# Patient Record
Sex: Female | Born: 1990
Health system: Southern US, Community
[De-identification: ages and names within clinical notes are randomized; demographics above are authoritative.]

## PROBLEM LIST (undated history)

## (undated) ENCOUNTER — Inpatient Hospital Stay (HOSPITAL_COMMUNITY): Payer: BC Managed Care – PPO

## (undated) ENCOUNTER — Inpatient Hospital Stay (HOSPITAL_COMMUNITY): Payer: Self-pay

## (undated) DIAGNOSIS — Z789 Other specified health status: Secondary | ICD-10-CM

## (undated) DIAGNOSIS — O09299 Supervision of pregnancy with other poor reproductive or obstetric history, unspecified trimester: Secondary | ICD-10-CM

## (undated) DIAGNOSIS — Z5189 Encounter for other specified aftercare: Secondary | ICD-10-CM

## (undated) DIAGNOSIS — D509 Iron deficiency anemia, unspecified: Secondary | ICD-10-CM

## (undated) HISTORY — DX: Iron deficiency anemia, unspecified: D50.9

## (undated) HISTORY — PX: ACHILLES TENDON REPAIR: SUR1153

---
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
## 2014-04-19 LAB — OB RESULTS CONSOLE HIV ANTIBODY (ROUTINE TESTING): HIV: NONREACTIVE

## 2014-04-26 ENCOUNTER — Other Ambulatory Visit (HOSPITAL_COMMUNITY)
Admission: RE | Admit: 2014-04-26 | Discharge: 2014-04-26 | Disposition: A | Discharge status: Home / Self Care | Payer: BC Managed Care – PPO | Source: Ambulatory Visit | Attending: Obstetrics and Gynecology | Admitting: Obstetrics and Gynecology

## 2014-04-26 ENCOUNTER — Other Ambulatory Visit: Payer: Self-pay | Admitting: Obstetrics and Gynecology

## 2014-04-26 DIAGNOSIS — Z01419 Encounter for gynecological examination (general) (routine) without abnormal findings: Secondary | ICD-10-CM | POA: Insufficient documentation

## 2014-04-26 DIAGNOSIS — Z113 Encounter for screening for infections with a predominantly sexual mode of transmission: Secondary | ICD-10-CM | POA: Insufficient documentation

## 2014-08-24 LAB — OB RESULTS CONSOLE ANTIBODY SCREEN: ANTIBODY SCREEN: NEGATIVE

## 2014-08-24 LAB — OB RESULTS CONSOLE ABO/RH: RH TYPE: POSITIVE

## 2014-08-24 LAB — OB RESULTS CONSOLE RPR: RPR: NONREACTIVE

## 2014-09-24 ENCOUNTER — Inpatient Hospital Stay (HOSPITAL_COMMUNITY)
Admission: AD | Admit: 2014-09-24 | Discharge: 2014-09-25 | Disposition: A | Discharge status: Home / Self Care | Payer: Medicaid Other | Source: Ambulatory Visit | Attending: Obstetrics and Gynecology | Admitting: Obstetrics and Gynecology

## 2014-09-24 ENCOUNTER — Encounter (HOSPITAL_COMMUNITY): Payer: Self-pay | Admitting: *Deleted

## 2014-09-24 DIAGNOSIS — O4700 False labor before 37 completed weeks of gestation, unspecified trimester: Secondary | ICD-10-CM | POA: Insufficient documentation

## 2014-09-24 DIAGNOSIS — T50905A Adverse effect of unspecified drugs, medicaments and biological substances, initial encounter: Secondary | ICD-10-CM

## 2014-09-24 DIAGNOSIS — T46905A Adverse effect of unspecified agents primarily affecting the cardiovascular system, initial encounter: Secondary | ICD-10-CM | POA: Insufficient documentation

## 2014-09-24 DIAGNOSIS — O4703 False labor before 37 completed weeks of gestation, third trimester: Secondary | ICD-10-CM

## 2014-09-24 HISTORY — DX: Other specified health status: Z78.9

## 2014-09-24 LAB — URINALYSIS, ROUTINE W REFLEX MICROSCOPIC
Bilirubin Urine: NEGATIVE
Glucose, UA: NEGATIVE mg/dL
HGB URINE DIPSTICK: NEGATIVE
Ketones, ur: NEGATIVE mg/dL
LEUKOCYTES UA: NEGATIVE
NITRITE: NEGATIVE
PROTEIN: NEGATIVE mg/dL
SPECIFIC GRAVITY, URINE: 1.005 (ref 1.005–1.030)
UROBILINOGEN UA: 0.2 mg/dL (ref 0.0–1.0)
pH: 7 (ref 5.0–8.0)

## 2014-09-24 LAB — WET PREP, GENITAL
Clue Cells Wet Prep HPF POC: NONE SEEN
Trich, Wet Prep: NONE SEEN
YEAST WET PREP: NONE SEEN

## 2014-09-24 LAB — FETAL FIBRONECTIN: FETAL FIBRONECTIN: NEGATIVE

## 2014-09-24 MED ORDER — NIFEDIPINE 10 MG PO CAPS
10.0000 mg | ORAL_CAPSULE | ORAL | Status: DC | PRN
Start: 2014-09-24 — End: 2014-09-25
  Administered 2014-09-24 (×3): 10 mg via ORAL
  Filled 2014-09-24 (×3): qty 1

## 2014-09-24 MED ORDER — NIFEDIPINE 10 MG PO CAPS: 10.0000 mg | ORAL_CAPSULE | Freq: Four times a day (QID) | ORAL | Status: DC | PRN

## 2014-09-24 MED ORDER — DIPHENHYDRAMINE HCL 25 MG PO CAPS
50.0000 mg | ORAL_CAPSULE | Freq: Once | ORAL | Status: AC
  Administered 2014-09-24: 50 mg via ORAL
  Filled 2014-09-24: qty 2

## 2014-09-24 NOTE — MAU Provider Note (Signed)
Chief Complaint:  Labor Eval   First Provider Initiated Contact with Patient 09/24/14 2125      HPI: Penny Abbott is a 23 y.o. G1P0 at [redacted]w[redacted]d who presents to maternity admissions reporting increasingly frequent, mild contractions since this afternoon. Every 10 minutes at their most frequent. Better with rest and fluids.   Denies fever, chills, vaginal bleeding, vaginal discharge, vaginal odor, leakage of fluid or urinary complaints. Good fetal movement.   Pregnancy Course: Uncomplicated. Her previous issues with preterm labor.  Past Medical History: Past Medical History  Diagnosis Date  . Medical history non-contributory     Past obstetric history: OB History  Gravida Para Term Preterm AB SAB TAB Ectopic Multiple Living  1             # Outcome Date GA Lbr Len/2nd Weight Sex Delivery Anes PTL Lv  1 CUR               Past Surgical History: Past Surgical History  Procedure Laterality Date  . Achilles tendon repair       Family History: No family history on file.  Social History: History  Substance Use Topics  . Smoking status: Never Smoker   . Smokeless tobacco: Not on file  . Alcohol Use: No    Allergies: No Known Allergies  Meds:  Prescriptions prior to admission  Medication Sig Dispense Refill  . Prenatal Vit-Fe Fumarate-FA (PRENATAL MULTIVITAMIN) TABS tablet Take 1 tablet by mouth daily at 12 noon.        ROS: Pertinent findings in history of present illness.  Physical Exam  Blood pressure 111/65, pulse 84, temperature 98.7 F (37.1 C), temperature source Oral, height  (1.676 m), weight 83.008 kg (183 lb), last menstrual period 02/20/2014. GENERAL: Well-developed, well-nourished female in no acute distress.  HEENT: normocephalic HEART: normal rate RESP: normal effort ABDOMEN: Soft, non-tender, gravid appropriate for gestational age EXTREMITIES: Nontender, no edema NEURO: alert and oriented PELVIC EXAM: NEFG, physiologic discharge, no  blood. Dilation: Closed Effacement (%): Thick Cervical Position: Posterior Station: Ballotable Exam by:: [place] CNM  FHT:  Baseline 120's , moderate variability, 15x15 accelerations present, rare, mild variable decelerations Contractions: Frequent uterine irritability initially and then contractions Q3-8 minutes, mild   Labs: Results for orders placed during the hospital encounter of 09/24/14 (from the past 24 hour(s))  URINALYSIS, ROUTINE W REFLEX MICROSCOPIC     Status: None   Collection Time    09/24/14  8:30 PM      Result Value Ref Range   Color, Urine YELLOW  YELLOW   APPearance CLEAR  CLEAR   Specific Gravity, Urine 1.005  1.005 - 1.030   pH 7.0  5.0 - 8.0   Glucose, UA NEGATIVE  NEGATIVE mg/dL   Hgb urine dipstick NEGATIVE  NEGATIVE   Bilirubin Urine NEGATIVE  NEGATIVE   Ketones, ur NEGATIVE  NEGATIVE mg/dL   Protein, ur NEGATIVE  NEGATIVE mg/dL   Urobilinogen, UA 0.2  0.0 - 1.0 mg/dL   Nitrite NEGATIVE  NEGATIVE   Leukocytes, UA NEGATIVE  NEGATIVE  FETAL FIBRONECTIN     Status: None   Collection Time    09/24/14  9:29 PM      Result Value Ref Range   Fetal Fibronectin NEGATIVE  NEGATIVE  WET PREP, GENITAL     Status: Abnormal   Collection Time    09/24/14 10:35 PM      Result Value Ref Range   Yeast Wet Prep HPF POC NONE  SEEN  NONE SEEN   Trich, Wet Prep NONE SEEN  NONE SEEN   Clue Cells Wet Prep HPF POC NONE SEEN  NONE SEEN   WBC, Wet Prep HPF POC FEW (*) NONE SEEN    Imaging:  No results found. MAU Course: Fetal fibronectin, Push fluids.  Dr. Richardson Dopp notified of cervical exam, contractions and negative fetal fibronectin. Procardia and wet mount ordered.  Contractions decreased. Now only UI. Discharge with Procardia prescription per Dr. Richardson Dopp.  2336: After three doses of Procardia pt reported feeling as if she had broken out on the skin on either side of her chin. Denies itching, swelling of lips or tongue, wheezing, difficulty breathing. No rash or  hives visible. Patient in no distress. Dr. Richardson Dopp notified. Give Benadryl 50 mg by mouth x1. Do not send patient home with Procardia prescription. Observe for one hour. No further fetal monitoring. BP 105/57  Pulse 95  Temp(Src) 98.7 F (37.1 C) (Oral)  Resp 16  Ht  (1.676 m)  Wt 83.008 kg (183 lb)  BMI 29.55 kg/m2  SpO2 98%  LMP 02/20/2014  "Breakout" resolved. No further reaction.   Assessment: 1. Preterm contractions, third trimester   2. Idiosyncratic reaction to medication after proper dose, initial encounter    Plan: Discharge home in stable condition per consult with Dr. Richardson Dopp. Labor precautions and fetal kick counts. Medication allergy precautions reviewed.     Follow-up Information   Follow up with Jessee Avers., MD 09/28/2014 As scheduled or as needed if symptoms worsen)    Specialty:  Obstetrics and Gynecology   Contact information:   [address] [place] [address] [phone]       Follow up with [facility] [place] [facility]. (As needed in emergencies)    Contact information:   [address] [id number] [place] [address] [phone]       Medication List         prenatal multivitamin Tabs tablet  Take 1 tablet by mouth daily at 12 noon.        [place], CNM 09/24/2014 11:22 PM

## 2014-09-24 NOTE — MAU Note (Signed)
Pt. States that she has been having irregular contractions for the past 3 days.  Today that have been more regular and her Dr. Catalina Pizza her to come in if she had uc's q 5 minutes for an hour.  Pt. States that her uc's are q 10 minutes while she is laying down, but when she gets up they are more frequent.  Pt. Denies any bleeding or gushes of fluid.

## 2014-09-24 NOTE — Discharge Instructions (Signed)
Preterm Labor Information Preterm labor is when labor starts at less than 37 weeks of pregnancy. The normal length of a pregnancy is 39 to 41 weeks. CAUSES Often, there is no identifiable underlying cause as to why a woman goes into preterm labor. One of the most common known causes of preterm labor is infection. Infections of the uterus, cervix, vagina, amniotic sac, bladder, kidney, or even the lungs (pneumonia) can cause labor to start. Other suspected causes of preterm labor include:   Urogenital infections, such as yeast infections and bacterial vaginosis.   Uterine abnormalities (uterine shape, uterine septum, fibroids, or bleeding from the placenta).   A cervix that has been operated on (it may fail to stay closed).   Malformations in the fetus.   Multiple gestations (twins, triplets, and so on).   Breakage of the amniotic sac.  RISK FACTORS  Having a previous history of preterm labor.   Having premature rupture of membranes (PROM).   Having a placenta that covers the opening of the cervix (placenta previa).   Having a placenta that separates from the uterus (placental abruption).   Having a cervix that is too weak to hold the fetus in the uterus (incompetent cervix).   Having too much fluid in the amniotic sac (polyhydramnios).   Taking illegal drugs or smoking while pregnant.   Not gaining enough weight while pregnant.   Being younger than 61 and older than 24 years old.   Having a low socioeconomic status.   Being African American. SYMPTOMS Signs and symptoms of preterm labor include:   Menstrual-like cramps, abdominal pain, or back pain.  Uterine contractions that are regular, as frequent as six in an hour, regardless of their intensity (may be mild or painful).  Contractions that start on the top of the uterus and spread down to the lower abdomen and back.   A sense of increased pelvic pressure.   A watery or bloody mucus discharge that  comes from the vagina.  TREATMENT Depending on the length of the pregnancy and other circumstances, your health care provider may suggest bed rest. If necessary, there are medicines that can be given to stop contractions and to mature the fetal lungs. If labor happens before 34 weeks of pregnancy, a prolonged hospital stay may be recommended. Treatment depends on the condition of both you and the fetus.  WHAT SHOULD YOU DO IF YOU THINK YOU ARE IN PRETERM LABOR? Call your health care provider right away. You will need to go to the hospital to get checked immediately. HOW CAN YOU PREVENT PRETERM LABOR IN FUTURE PREGNANCIES? You should:   Stop smoking if you smoke.  Maintain healthy weight gain and avoid chemicals and drugs that are not necessary.  Be watchful for any type of infection.  Inform your health care provider if you have a known history of preterm labor. Document Released: 03/08/2004 Document Revised: 08/19/2013 Document Reviewed: 01/19/2013 [facility] Patient Information 2015 [place], [place]. This information is not intended to replace advice given to you by your health care provider. Make sure you discuss any questions you have with your health care provider.  Braxton Hicks Contractions Contractions of the uterus can occur throughout pregnancy. Contractions are not always a sign that you are in labor.  WHAT ARE BRAXTON HICKS CONTRACTIONS?  Contractions that occur before labor are called Braxton Hicks contractions, or false labor. Toward the end of pregnancy (32-34 weeks), these contractions can develop more often and may become more forceful. This is not true  not true labor because these contractions do not result in opening (dilatation) and thinning of the cervix. They are sometimes difficult to tell apart from true labor because these contractions can be forceful and people have different pain tolerances. You should not feel embarrassed if you go to the hospital with false labor.  Sometimes, the only way to tell if you are in true labor is for your health care provider to look for changes in the cervix. °If there are no prenatal problems or other health problems associated with the pregnancy, it is completely safe to be sent home with false labor and await the onset of true labor. °HOW CAN YOU TELL THE DIFFERENCE BETWEEN TRUE AND FALSE LABOR? °False Labor °· The contractions of false labor are usually shorter and not as hard as those of true labor.   °· The contractions are usually irregular.   °· The contractions are often felt in the front of the lower abdomen and in the groin.   °· The contractions may go away when you walk around or change positions while lying down.   °· The contractions get weaker and are shorter lasting as time goes on.   °· The contractions do not usually become progressively stronger, regular, and closer together as with true labor.   °True Labor °· Contractions in true labor last 30-70 seconds, become very regular, usually become more intense, and increase in frequency.   °· The contractions do not go away with walking.   °· The discomfort is usually felt in the top of the uterus and spreads to the lower abdomen and low back.   °· True labor can be determined by your health care provider with an exam. This will show that the cervix is dilating and getting thinner.   °WHAT TO REMEMBER °· Keep up with your usual exercises and follow other instructions given by your health care provider.   °· Take medicines as directed by your health care provider.   °· Keep your regular prenatal appointments.   °· Eat and drink lightly if you think you are going into labor.   °· If Braxton Hicks contractions are making you uncomfortable:   °¨ Change your position from lying down or resting to walking, or from walking to resting.   °¨ Sit and rest in a tub of warm water.   °¨ Drink 2-3 glasses of water. Dehydration may cause these contractions.   °¨ Do slow and deep breathing several  times an hour.   °WHEN SHOULD I SEEK IMMEDIATE MEDICAL CARE? °Seek immediate medical care if: °· Your contractions become stronger, more regular, and closer together.   °· You have fluid leaking or gushing from your vagina.   °· You have a fever.   °· You pass blood-tinged mucus.   °· You have vaginal bleeding.   °· You have continuous abdominal pain.   °· You have low back pain that you never had before.   °· You feel your baby's head pushing down and causing pelvic pressure.   °· Your baby is not moving as much as it used to.   °Document Released: 12/17/2005 Document Revised: 12/22/2013 Document Reviewed: 09/28/2013 °[employer]® Patient Information ©2015 [employer]. This information is not intended to replace advice given to you by your health care provider. Make sure you discuss any questions you have with your health care provider. ° °

## 2014-10-26 LAB — OB RESULTS CONSOLE GC/CHLAMYDIA
Chlamydia: NEGATIVE
Gonorrhea: NEGATIVE

## 2014-10-26 LAB — OB RESULTS CONSOLE GBS: GBS: NEGATIVE

## 2014-11-01 ENCOUNTER — Encounter (HOSPITAL_COMMUNITY): Payer: Self-pay | Admitting: *Deleted

## 2014-11-22 ENCOUNTER — Inpatient Hospital Stay (HOSPITAL_COMMUNITY)
Admission: AD | Admit: 2014-11-22 | Discharge: 2014-11-26 | DRG: 765 | Disposition: A | Discharge status: Home / Self Care | Payer: Medicaid Other | Source: Ambulatory Visit | Attending: Obstetrics and Gynecology | Admitting: Obstetrics and Gynecology

## 2014-11-22 ENCOUNTER — Encounter (HOSPITAL_COMMUNITY): Payer: Self-pay | Admitting: *Deleted

## 2014-11-22 DIAGNOSIS — O8612 Endometritis following delivery: Secondary | ICD-10-CM | POA: Diagnosis present

## 2014-11-22 DIAGNOSIS — D649 Anemia, unspecified: Secondary | ICD-10-CM | POA: Diagnosis not present

## 2014-11-22 DIAGNOSIS — O324XX0 Maternal care for high head at term, not applicable or unspecified: Secondary | ICD-10-CM | POA: Diagnosis present

## 2014-11-22 DIAGNOSIS — Z3490 Encounter for supervision of normal pregnancy, unspecified, unspecified trimester: Secondary | ICD-10-CM

## 2014-11-22 DIAGNOSIS — Z3A39 39 weeks gestation of pregnancy: Secondary | ICD-10-CM | POA: Diagnosis present

## 2014-11-22 DIAGNOSIS — Z3403 Encounter for supervision of normal first pregnancy, third trimester: Secondary | ICD-10-CM | POA: Diagnosis present

## 2014-11-22 DIAGNOSIS — Z98891 History of uterine scar from previous surgery: Secondary | ICD-10-CM

## 2014-11-22 LAB — CBC
HCT: 39.7 % (ref 36.0–46.0)
Hemoglobin: 13.8 g/dL (ref 12.0–15.0)
MCH: 32.5 pg (ref 26.0–34.0)
MCHC: 34.8 g/dL (ref 30.0–36.0)
MCV: 93.6 fL (ref 78.0–100.0)
PLATELETS: 136 10*3/uL — AB (ref 150–400)
RBC: 4.24 MIL/uL (ref 3.87–5.11)
RDW: 14.4 % (ref 11.5–15.5)
WBC: 13.7 10*3/uL — AB (ref 4.0–10.5)

## 2014-11-22 MED ORDER — LACTATED RINGERS IV SOLN
INTRAVENOUS | Status: DC
  Administered 2014-11-22 – 2014-11-23 (×5): via INTRAVENOUS

## 2014-11-22 MED ORDER — OXYTOCIN BOLUS FROM INFUSION: 500.0000 mL | INTRAVENOUS | Status: DC

## 2014-11-22 MED ORDER — OXYCODONE-ACETAMINOPHEN 5-325 MG PO TABS: 2.0000 | ORAL_TABLET | ORAL | Status: DC | PRN

## 2014-11-22 MED ORDER — CITRIC ACID-SODIUM CITRATE 334-500 MG/5ML PO SOLN
30.0000 mL | ORAL | Status: DC | PRN
  Administered 2014-11-23: 30 mL via ORAL
  Filled 2014-11-22: qty 15

## 2014-11-22 MED ORDER — ACETAMINOPHEN 325 MG PO TABS: 650.0000 mg | ORAL_TABLET | ORAL | Status: DC | PRN

## 2014-11-22 MED ORDER — LIDOCAINE HCL (PF) 1 % IJ SOLN
30.0000 mL | INTRAMUSCULAR | Status: DC | PRN
  Filled 2014-11-22: qty 30

## 2014-11-22 MED ORDER — OXYCODONE-ACETAMINOPHEN 5-325 MG PO TABS: 1.0000 | ORAL_TABLET | ORAL | Status: DC | PRN

## 2014-11-22 MED ORDER — BUTORPHANOL TARTRATE 1 MG/ML IJ SOLN
1.0000 mg | INTRAMUSCULAR | Status: DC | PRN
  Administered 2014-11-22 – 2014-11-23 (×2): 1 mg via INTRAVENOUS
  Filled 2014-11-22 (×2): qty 1

## 2014-11-22 MED ORDER — OXYTOCIN 40 UNITS IN LACTATED RINGERS INFUSION - SIMPLE MED
62.5000 mL/h | INTRAVENOUS | Status: DC
  Filled 2014-11-22: qty 1000

## 2014-11-22 MED ORDER — ONDANSETRON HCL 4 MG/2ML IJ SOLN: 4.0000 mg | Freq: Four times a day (QID) | INTRAMUSCULAR | Status: DC | PRN

## 2014-11-22 MED ORDER — LACTATED RINGERS IV SOLN: 500.0000 mL | INTRAVENOUS | Status: DC | PRN

## 2014-11-22 NOTE — MAU Note (Signed)
Pt reports uc's q 2-5 minutes since 6pm,

## 2014-11-23 ENCOUNTER — Inpatient Hospital Stay (HOSPITAL_COMMUNITY): Payer: Medicaid Other | Admitting: Certified Registered"

## 2014-11-23 ENCOUNTER — Encounter (HOSPITAL_COMMUNITY)
Admission: AD | Disposition: A | Discharge status: Home / Self Care | Payer: Self-pay | Source: Ambulatory Visit | Attending: Obstetrics and Gynecology

## 2014-11-23 ENCOUNTER — Inpatient Hospital Stay (HOSPITAL_COMMUNITY): Payer: Medicaid Other

## 2014-11-23 ENCOUNTER — Encounter (HOSPITAL_COMMUNITY): Payer: Self-pay | Admitting: Registered Nurse

## 2014-11-23 ENCOUNTER — Inpatient Hospital Stay (HOSPITAL_COMMUNITY): Payer: Medicaid Other | Admitting: Anesthesiology

## 2014-11-23 ENCOUNTER — Encounter (HOSPITAL_COMMUNITY): Payer: Self-pay | Admitting: Anesthesiology

## 2014-11-23 HISTORY — PX: DILATION AND EVACUATION: SHX1459

## 2014-11-23 LAB — CBC
HCT: 38 % (ref 36.0–46.0)
HCT: 35.8 % — ABNORMAL LOW (ref 36.0–46.0)
HCT: 27.7 % — ABNORMAL LOW (ref 36.0–46.0)
HEMOGLOBIN: 12.9 g/dL (ref 12.0–15.0)
Hemoglobin: 12.1 g/dL (ref 12.0–15.0)
Hemoglobin: 9.4 g/dL — ABNORMAL LOW (ref 12.0–15.0)
MCH: 32.1 pg (ref 26.0–34.0)
MCH: 32.1 pg (ref 26.0–34.0)
MCH: 32.3 pg (ref 26.0–34.0)
MCHC: 33.9 g/dL (ref 30.0–36.0)
MCHC: 33.8 g/dL (ref 30.0–36.0)
MCHC: 33.9 g/dL (ref 30.0–36.0)
MCV: 94.5 fL (ref 78.0–100.0)
MCV: 95 fL (ref 78.0–100.0)
MCV: 95.2 fL (ref 78.0–100.0)
PLATELETS: 124 10*3/uL — AB (ref 150–400)
Platelets: 134 10*3/uL — ABNORMAL LOW (ref 150–400)
Platelets: 126 10*3/uL — ABNORMAL LOW (ref 150–400)
RBC: 4.02 MIL/uL (ref 3.87–5.11)
RBC: 3.77 MIL/uL — AB (ref 3.87–5.11)
RBC: 2.91 MIL/uL — AB (ref 3.87–5.11)
RDW: 14.4 % (ref 11.5–15.5)
RDW: 14.4 % (ref 11.5–15.5)
RDW: 14.4 % (ref 11.5–15.5)
WBC: 14.8 10*3/uL — ABNORMAL HIGH (ref 4.0–10.5)
WBC: 19.4 10*3/uL — AB (ref 4.0–10.5)
WBC: 21.3 10*3/uL — ABNORMAL HIGH (ref 4.0–10.5)

## 2014-11-23 LAB — COMPREHENSIVE METABOLIC PANEL
ALBUMIN: 3 g/dL — AB (ref 3.5–5.2)
ALK PHOS: 130 U/L — AB (ref 39–117)
ALT: 13 U/L (ref 0–35)
ANION GAP: 13 (ref 5–15)
AST: 17 U/L (ref 0–37)
BUN: 8 mg/dL (ref 6–23)
CO2: 22 mEq/L (ref 19–32)
Calcium: 9.2 mg/dL (ref 8.4–10.5)
Chloride: 101 mEq/L (ref 96–112)
Creatinine, Ser: 0.53 mg/dL (ref 0.50–1.10)
GFR calc Af Amer: >90 mL/min (ref >90)
GFR calc non Af Amer: >90 mL/min (ref >90)
Glucose, Bld: 91 mg/dL (ref 70–99)
Potassium: 4 mEq/L (ref 3.7–5.3)
Sodium: 136 mEq/L — ABNORMAL LOW (ref 137–147)
TOTAL PROTEIN: 6.3 g/dL (ref 6.0–8.3)
Total Bilirubin: 0.2 mg/dL — ABNORMAL LOW (ref 0.3–1.2)

## 2014-11-23 LAB — ABO/RH: ABO/RH(D): A POS

## 2014-11-23 LAB — URIC ACID: Uric Acid, Serum: 3.1 mg/dL (ref 2.4–7.0)

## 2014-11-23 LAB — HEPATITIS B SURFACE ANTIGEN: Hepatitis B Surface Ag: NEGATIVE

## 2014-11-23 LAB — FIBRINOGEN: FIBRINOGEN: 250 mg/dL (ref 204–475)

## 2014-11-23 LAB — LACTATE DEHYDROGENASE: LDH: 249 U/L (ref 94–250)

## 2014-11-23 LAB — HIV ANTIBODY (ROUTINE TESTING W REFLEX): HIV: NONREACTIVE

## 2014-11-23 LAB — PROTIME-INR
INR: 1.24 (ref 0.00–1.49)
PROTHROMBIN TIME: 15.7 s — AB (ref 11.6–15.2)

## 2014-11-23 LAB — APTT: aPTT: 31 seconds (ref 24–37)

## 2014-11-23 LAB — RUBELLA SCREEN: Rubella: 2.59 Index — ABNORMAL HIGH (ref <0.90)

## 2014-11-23 LAB — RPR

## 2014-11-23 SURGERY — Surgical Case
Anesthesia: Epidural

## 2014-11-23 SURGERY — DILATION AND EVACUATION, UTERUS
Anesthesia: Spinal | Site: Vagina

## 2014-11-23 MED ORDER — METOCLOPRAMIDE HCL 5 MG/ML IJ SOLN: 10.0000 mg | Freq: Once | INTRAMUSCULAR | Status: AC | PRN

## 2014-11-23 MED ORDER — METHYLERGONOVINE MALEATE 0.2 MG PO TABS
0.2000 mg | ORAL_TABLET | Freq: Four times a day (QID) | ORAL | Status: DC
  Administered 2014-11-23: 0.2 mg via ORAL
  Filled 2014-11-23: qty 1

## 2014-11-23 MED ORDER — MISOPROSTOL 200 MCG PO TABS
ORAL_TABLET | ORAL | Status: AC
  Administered 2014-11-23: 1000 ug via RECTAL
  Filled 2014-11-23: qty 5

## 2014-11-23 MED ORDER — FENTANYL 2.5 MCG/ML BUPIVACAINE 1/10 % EPIDURAL INFUSION (WH - ANES)
14.0000 mL/h | INTRAMUSCULAR | Status: DC | PRN
  Administered 2014-11-23 (×2): 14 mL/h via EPIDURAL
  Filled 2014-11-23 (×2): qty 125

## 2014-11-23 MED ORDER — NALOXONE HCL 1 MG/ML IJ SOLN: 1.0000 ug/kg/h | INTRAVENOUS | Status: DC | PRN

## 2014-11-23 MED ORDER — FENTANYL CITRATE 0.05 MG/ML IJ SOLN
INTRAMUSCULAR | Status: AC
  Administered 2014-11-23: 50 ug via INTRAVENOUS
  Filled 2014-11-23: qty 2

## 2014-11-23 MED ORDER — SODIUM BICARBONATE 8.4 % IV SOLN
INTRAVENOUS | Status: AC
  Filled 2014-11-23: qty 50

## 2014-11-23 MED ORDER — OXYCODONE-ACETAMINOPHEN 5-325 MG PO TABS: 2.0000 | ORAL_TABLET | ORAL | Status: DC | PRN

## 2014-11-23 MED ORDER — BUPIVACAINE HCL (PF) 0.25 % IJ SOLN
INTRAMUSCULAR | Status: AC
  Filled 2014-11-23: qty 30

## 2014-11-23 MED ORDER — FENTANYL CITRATE 0.05 MG/ML IJ SOLN
INTRAMUSCULAR | Status: AC
  Filled 2014-11-23: qty 2

## 2014-11-23 MED ORDER — PHENYLEPHRINE HCL 10 MG/ML IJ SOLN
INTRAMUSCULAR | Status: DC | PRN
  Administered 2014-11-23: 40 ug via INTRAVENOUS
  Administered 2014-11-23: 80 ug via INTRAVENOUS

## 2014-11-23 MED ORDER — OXYTOCIN 40 UNITS IN LACTATED RINGERS INFUSION - SIMPLE MED: 62.5000 mL/h | INTRAVENOUS | Status: AC

## 2014-11-23 MED ORDER — FENTANYL CITRATE 0.05 MG/ML IJ SOLN
INTRAMUSCULAR | Status: AC
Start: 2014-11-23 — End: 2014-11-23
  Filled 2014-11-23: qty 2

## 2014-11-23 MED ORDER — LANOLIN HYDROUS EX OINT: 1.0000 "application " | TOPICAL_OINTMENT | CUTANEOUS | Status: DC | PRN

## 2014-11-23 MED ORDER — CLINDAMYCIN PHOSPHATE 900 MG/50ML IV SOLN
900.0000 mg | Freq: Three times a day (TID) | INTRAVENOUS | Status: DC
  Administered 2014-11-24 – 2014-11-26 (×8): 900 mg via INTRAVENOUS
  Filled 2014-11-23 (×10): qty 50

## 2014-11-23 MED ORDER — WITCH HAZEL-GLYCERIN EX PADS: 1.0000 "application " | MEDICATED_PAD | CUTANEOUS | Status: DC | PRN

## 2014-11-23 MED ORDER — OXYCODONE-ACETAMINOPHEN 5-325 MG PO TABS
1.0000 | ORAL_TABLET | ORAL | Status: DC | PRN
  Administered 2014-11-24: 1 via ORAL
  Filled 2014-11-23: qty 1

## 2014-11-23 MED ORDER — SODIUM CHLORIDE 0.9 % IJ SOLN: 3.0000 mL | INTRAMUSCULAR | Status: DC | PRN

## 2014-11-23 MED ORDER — LIDOCAINE HCL (PF) 1 % IJ SOLN
INTRAMUSCULAR | Status: DC | PRN
  Administered 2014-11-23 (×4): 4 mL

## 2014-11-23 MED ORDER — NALBUPHINE HCL 10 MG/ML IJ SOLN
5.0000 mg | INTRAMUSCULAR | Status: DC | PRN
  Administered 2014-11-24: 5 mg via INTRAVENOUS
  Filled 2014-11-23: qty 1

## 2014-11-23 MED ORDER — FENTANYL CITRATE 0.05 MG/ML IJ SOLN: 25.0000 ug | INTRAMUSCULAR | Status: DC | PRN

## 2014-11-23 MED ORDER — FENTANYL CITRATE 0.05 MG/ML IJ SOLN
25.0000 ug | INTRAMUSCULAR | Status: DC | PRN
  Administered 2014-11-23 (×2): 50 ug via INTRAVENOUS

## 2014-11-23 MED ORDER — METHYLERGONOVINE MALEATE 0.2 MG PO TABS: 0.2000 mg | ORAL_TABLET | ORAL | Status: DC | PRN

## 2014-11-23 MED ORDER — SIMETHICONE 80 MG PO CHEW
80.0000 mg | CHEWABLE_TABLET | Freq: Three times a day (TID) | ORAL | Status: DC
  Administered 2014-11-24 – 2014-11-26 (×8): 80 mg via ORAL
  Filled 2014-11-23 (×8): qty 1

## 2014-11-23 MED ORDER — IBUPROFEN 600 MG PO TABS
600.0000 mg | ORAL_TABLET | Freq: Four times a day (QID) | ORAL | Status: DC
  Administered 2014-11-23 – 2014-11-26 (×12): 600 mg via ORAL
  Filled 2014-11-23 (×12): qty 1

## 2014-11-23 MED ORDER — MIDAZOLAM HCL 2 MG/2ML IJ SOLN
INTRAMUSCULAR | Status: AC
  Filled 2014-11-23: qty 2

## 2014-11-23 MED ORDER — DEXAMETHASONE SODIUM PHOSPHATE 10 MG/ML IJ SOLN
INTRAMUSCULAR | Status: AC
  Filled 2014-11-23: qty 1

## 2014-11-23 MED ORDER — MEPERIDINE HCL 25 MG/ML IJ SOLN: 6.2500 mg | INTRAMUSCULAR | Status: DC | PRN

## 2014-11-23 MED ORDER — EPHEDRINE 5 MG/ML INJ: 10.0000 mg | INTRAVENOUS | Status: DC | PRN

## 2014-11-23 MED ORDER — METHYLERGONOVINE MALEATE 0.2 MG/ML IJ SOLN
INTRAMUSCULAR | Status: AC
  Filled 2014-11-23: qty 1

## 2014-11-23 MED ORDER — SENNOSIDES-DOCUSATE SODIUM 8.6-50 MG PO TABS
2.0000 | ORAL_TABLET | ORAL | Status: DC
  Administered 2014-11-23 – 2014-11-25 (×3): 2 via ORAL
  Filled 2014-11-23 (×3): qty 2

## 2014-11-23 MED ORDER — NALBUPHINE HCL 10 MG/ML IJ SOLN: 5.0000 mg | Freq: Once | INTRAMUSCULAR | Status: AC | PRN

## 2014-11-23 MED ORDER — ZOLPIDEM TARTRATE 5 MG PO TABS: 5.0000 mg | ORAL_TABLET | Freq: Every evening | ORAL | Status: DC | PRN

## 2014-11-23 MED ORDER — SCOPOLAMINE 1 MG/3DAYS TD PT72
MEDICATED_PATCH | TRANSDERMAL | Status: DC | PRN
  Administered 2014-11-23: 1 via TRANSDERMAL

## 2014-11-23 MED ORDER — SODIUM CHLORIDE 0.9 % IV SOLN
3.0000 g | Freq: Four times a day (QID) | INTRAVENOUS | Status: DC
  Administered 2014-11-23 – 2014-11-26 (×11): 3 g via INTRAVENOUS
  Filled 2014-11-23 (×14): qty 3

## 2014-11-23 MED ORDER — METHYLERGONOVINE MALEATE 0.2 MG PO TABS
0.2000 mg | ORAL_TABLET | ORAL | Status: AC
  Administered 2014-11-24 (×6): 0.2 mg via ORAL
  Filled 2014-11-23 (×6): qty 1

## 2014-11-23 MED ORDER — METHYLERGONOVINE MALEATE 0.2 MG/ML IJ SOLN
INTRAMUSCULAR | Status: DC | PRN
  Administered 2014-11-23: 0.2 mg via INTRAMUSCULAR

## 2014-11-23 MED ORDER — BUPIVACAINE IN DEXTROSE 0.75-8.25 % IT SOLN
INTRATHECAL | Status: DC | PRN
  Administered 2014-11-23: 1.3 mL via INTRATHECAL

## 2014-11-23 MED ORDER — LIDOCAINE HCL (CARDIAC) 20 MG/ML IV SOLN
INTRAVENOUS | Status: AC
  Filled 2014-11-23: qty 5

## 2014-11-23 MED ORDER — MORPHINE SULFATE (PF) 0.5 MG/ML IJ SOLN
INTRAMUSCULAR | Status: DC | PRN
  Administered 2014-11-23: 3 mg via EPIDURAL

## 2014-11-23 MED ORDER — ACETAMINOPHEN 500 MG PO TABS
ORAL_TABLET | ORAL | Status: AC
  Filled 2014-11-23: qty 1

## 2014-11-23 MED ORDER — OXYTOCIN 10 UNIT/ML IJ SOLN
40.0000 [IU] | INTRAMUSCULAR | Status: DC | PRN
  Administered 2014-11-23: 40 [IU] via INTRAVENOUS

## 2014-11-23 MED ORDER — MISOPROSTOL 200 MCG PO TABS
1000.0000 ug | ORAL_TABLET | Freq: Once | ORAL | Status: AC
  Administered 2014-11-23: 1000 ug via RECTAL

## 2014-11-23 MED ORDER — LACTATED RINGERS IV SOLN
INTRAVENOUS | Status: DC
  Administered 2014-11-23 – 2014-11-24 (×5): via INTRAVENOUS
  Administered 2014-11-24: 125 mL/h via INTRAVENOUS

## 2014-11-23 MED ORDER — FERROUS SULFATE 325 (65 FE) MG PO TABS
325.0000 mg | ORAL_TABLET | Freq: Two times a day (BID) | ORAL | Status: DC
  Administered 2014-11-24 – 2014-11-26 (×5): 325 mg via ORAL
  Filled 2014-11-23 (×5): qty 1

## 2014-11-23 MED ORDER — SCOPOLAMINE 1 MG/3DAYS TD PT72
MEDICATED_PATCH | TRANSDERMAL | Status: AC
  Filled 2014-11-23: qty 1

## 2014-11-23 MED ORDER — DIPHENOXYLATE-ATROPINE 2.5-0.025 MG PO TABS
2.0000 | ORAL_TABLET | Freq: Once | ORAL | Status: AC
  Administered 2014-11-23: 2 via ORAL

## 2014-11-23 MED ORDER — NALBUPHINE HCL 10 MG/ML IJ SOLN: 5.0000 mg | INTRAMUSCULAR | Status: DC | PRN

## 2014-11-23 MED ORDER — CEFAZOLIN SODIUM-DEXTROSE 2-3 GM-% IV SOLR
2.0000 g | Freq: Once | INTRAVENOUS | Status: AC
  Administered 2014-11-23: 2 g via INTRAVENOUS
  Filled 2014-11-23: qty 50

## 2014-11-23 MED ORDER — DIPHENHYDRAMINE HCL 50 MG/ML IJ SOLN
12.5000 mg | INTRAMUSCULAR | Status: DC | PRN
  Administered 2014-11-23: 12.5 mg via INTRAVENOUS

## 2014-11-23 MED ORDER — MENTHOL 3 MG MT LOZG: 1.0000 | LOZENGE | OROMUCOSAL | Status: DC | PRN

## 2014-11-23 MED ORDER — ONDANSETRON HCL 4 MG/2ML IJ SOLN: 4.0000 mg | Freq: Three times a day (TID) | INTRAMUSCULAR | Status: DC | PRN

## 2014-11-23 MED ORDER — PROPOFOL 10 MG/ML IV BOLUS
INTRAVENOUS | Status: DC | PRN
  Administered 2014-11-23: 30 mg via INTRAVENOUS
  Administered 2014-11-23: 20 mg via INTRAVENOUS

## 2014-11-23 MED ORDER — CARBOPROST TROMETHAMINE 250 MCG/ML IM SOLN
INTRAMUSCULAR | Status: AC
  Administered 2014-11-23: 250 ug via INTRAMUSCULAR
  Filled 2014-11-23: qty 1

## 2014-11-23 MED ORDER — ONDANSETRON HCL 4 MG PO TABS: 4.0000 mg | ORAL_TABLET | ORAL | Status: DC | PRN

## 2014-11-23 MED ORDER — DIPHENHYDRAMINE HCL 25 MG PO CAPS: 25.0000 mg | ORAL_CAPSULE | Freq: Four times a day (QID) | ORAL | Status: DC | PRN

## 2014-11-23 MED ORDER — PRENATAL MULTIVITAMIN CH
1.0000 | ORAL_TABLET | Freq: Every day | ORAL | Status: DC
  Administered 2014-11-25 – 2014-11-26 (×2): 1 via ORAL
  Filled 2014-11-23 (×3): qty 1

## 2014-11-23 MED ORDER — ONDANSETRON HCL 4 MG/2ML IJ SOLN: 4.0000 mg | INTRAMUSCULAR | Status: DC | PRN

## 2014-11-23 MED ORDER — ONDANSETRON HCL 4 MG/2ML IJ SOLN
INTRAMUSCULAR | Status: AC
  Filled 2014-11-23: qty 2

## 2014-11-23 MED ORDER — DIPHENHYDRAMINE HCL 50 MG/ML IJ SOLN
INTRAMUSCULAR | Status: AC
  Filled 2014-11-23: qty 1

## 2014-11-23 MED ORDER — MORPHINE SULFATE 0.5 MG/ML IJ SOLN
INTRAMUSCULAR | Status: AC
  Filled 2014-11-23: qty 10

## 2014-11-23 MED ORDER — SCOPOLAMINE 1 MG/3DAYS TD PT72: 1.0000 | MEDICATED_PATCH | Freq: Once | TRANSDERMAL | Status: DC

## 2014-11-23 MED ORDER — METHYLERGONOVINE MALEATE 0.2 MG/ML IJ SOLN
0.2000 mg | INTRAMUSCULAR | Status: DC | PRN
  Administered 2014-11-23: 0.2 mg via INTRAMUSCULAR

## 2014-11-23 MED ORDER — SIMETHICONE 80 MG PO CHEW
80.0000 mg | CHEWABLE_TABLET | ORAL | Status: DC
  Administered 2014-11-23 – 2014-11-25 (×3): 80 mg via ORAL
  Filled 2014-11-23 (×3): qty 1

## 2014-11-23 MED ORDER — SIMETHICONE 80 MG PO CHEW: 80.0000 mg | CHEWABLE_TABLET | ORAL | Status: DC | PRN

## 2014-11-23 MED ORDER — MIDAZOLAM HCL 2 MG/2ML IJ SOLN
INTRAMUSCULAR | Status: DC | PRN
  Administered 2014-11-23: 2 mg via INTRAVENOUS

## 2014-11-23 MED ORDER — ACETAMINOPHEN 500 MG PO TABS
1000.0000 mg | ORAL_TABLET | Freq: Once | ORAL | Status: AC
  Administered 2014-11-23: 1000 mg via ORAL

## 2014-11-23 MED ORDER — PHENYLEPHRINE 40 MCG/ML (10ML) SYRINGE FOR IV PUSH (FOR BLOOD PRESSURE SUPPORT): 80.0000 ug | PREFILLED_SYRINGE | INTRAVENOUS | Status: DC | PRN

## 2014-11-23 MED ORDER — OXYTOCIN 10 UNIT/ML IJ SOLN
INTRAMUSCULAR | Status: AC
  Filled 2014-11-23: qty 4

## 2014-11-23 MED ORDER — PHENYLEPHRINE 40 MCG/ML (10ML) SYRINGE FOR IV PUSH (FOR BLOOD PRESSURE SUPPORT)
80.0000 ug | PREFILLED_SYRINGE | INTRAVENOUS | Status: DC | PRN
  Filled 2014-11-23: qty 10

## 2014-11-23 MED ORDER — FENTANYL CITRATE 0.05 MG/ML IJ SOLN
INTRAMUSCULAR | Status: DC | PRN
  Administered 2014-11-23 (×2): 25 ug via INTRAVENOUS
  Administered 2014-11-23: 50 ug via INTRAVENOUS
  Administered 2014-11-23 (×3): 25 ug via INTRAVENOUS
  Administered 2014-11-23 (×2): 50 ug via INTRAVENOUS
  Administered 2014-11-23: 25 ug via INTRAVENOUS

## 2014-11-23 MED ORDER — DIPHENHYDRAMINE HCL 50 MG/ML IJ SOLN: 12.5000 mg | INTRAMUSCULAR | Status: DC | PRN

## 2014-11-23 MED ORDER — CARBOPROST TROMETHAMINE 250 MCG/ML IM SOLN
250.0000 ug | Freq: Once | INTRAMUSCULAR | Status: AC
  Administered 2014-11-23: 250 ug via INTRAMUSCULAR

## 2014-11-23 MED ORDER — KETOROLAC TROMETHAMINE 30 MG/ML IJ SOLN
INTRAMUSCULAR | Status: AC
  Filled 2014-11-23: qty 1

## 2014-11-23 MED ORDER — PHENYLEPHRINE 40 MCG/ML (10ML) SYRINGE FOR IV PUSH (FOR BLOOD PRESSURE SUPPORT)
PREFILLED_SYRINGE | INTRAVENOUS | Status: AC
Start: 2014-11-23 — End: 2014-11-23
  Filled 2014-11-23: qty 10

## 2014-11-23 MED ORDER — DIBUCAINE 1 % RE OINT: 1.0000 "application " | TOPICAL_OINTMENT | RECTAL | Status: DC | PRN

## 2014-11-23 MED ORDER — DIPHENHYDRAMINE HCL 25 MG PO CAPS
25.0000 mg | ORAL_CAPSULE | ORAL | Status: DC | PRN
  Administered 2014-11-24: 25 mg via ORAL
  Filled 2014-11-23: qty 1

## 2014-11-23 MED ORDER — PHENYLEPHRINE 8 MG IN D5W 100 ML (0.08MG/ML) PREMIX OPTIME
INJECTION | INTRAVENOUS | Status: DC | PRN
  Administered 2014-11-23: 60 ug/min via INTRAVENOUS

## 2014-11-23 MED ORDER — NALOXONE HCL 0.4 MG/ML IJ SOLN: 0.4000 mg | INTRAMUSCULAR | Status: DC | PRN

## 2014-11-23 MED ORDER — LACTATED RINGERS IV SOLN
500.0000 mL | Freq: Once | INTRAVENOUS | Status: AC
  Administered 2014-11-23: 500 mL via INTRAVENOUS

## 2014-11-23 MED ORDER — ONDANSETRON HCL 4 MG/2ML IJ SOLN
INTRAMUSCULAR | Status: DC | PRN
  Administered 2014-11-23: 4 mg via INTRAVENOUS

## 2014-11-23 MED ORDER — LIDOCAINE-EPINEPHRINE (PF) 2 %-1:200000 IJ SOLN
INTRAMUSCULAR | Status: AC
  Filled 2014-11-23: qty 20

## 2014-11-23 MED ORDER — PROPOFOL 10 MG/ML IV EMUL
INTRAVENOUS | Status: AC
  Filled 2014-11-23: qty 20

## 2014-11-23 MED ORDER — SODIUM BICARBONATE 8.4 % IV SOLN
INTRAVENOUS | Status: DC | PRN
  Administered 2014-11-23 (×4): 5 mL via EPIDURAL

## 2014-11-23 SURGICAL SUPPLY — 41 items
APL SKNCLS STERI-STRIP NONHPOA (GAUZE/BANDAGES/DRESSINGS) ×1
BARRIER ADHS 3X4 INTERCEED (GAUZE/BANDAGES/DRESSINGS) ×2 IMPLANT
BENZOIN TINCTURE PRP APPL 2/3 (GAUZE/BANDAGES/DRESSINGS) ×3 IMPLANT
BRR ADH 4X3 ABS CNTRL BYND (GAUZE/BANDAGES/DRESSINGS) ×1
CLAMP CORD UMBIL (MISCELLANEOUS) IMPLANT
CLOSURE WOUND 1/2 X4 (GAUZE/BANDAGES/DRESSINGS) ×1
CLOTH BEACON ORANGE TIMEOUT ST (SAFETY) ×3 IMPLANT
CONTAINER PREFILL 10% NBF 15ML (MISCELLANEOUS) IMPLANT
DRAPE SHEET LG 3/4 BI-LAMINATE (DRAPES) IMPLANT
DRSG OPSITE POSTOP 4X10 (GAUZE/BANDAGES/DRESSINGS) ×3 IMPLANT
DURAPREP 26ML APPLICATOR (WOUND CARE) ×3 IMPLANT
ELECT REM PT RETURN 9FT ADLT (ELECTROSURGICAL) ×3
ELECTRODE REM PT RTRN 9FT ADLT (ELECTROSURGICAL) ×1 IMPLANT
EXTRACTOR VACUUM M CUP 4 TUBE (SUCTIONS) IMPLANT
EXTRACTOR VACUUM M CUP 4' TUBE (SUCTIONS)
GLOVE BIOGEL M 6.5 STRL (GLOVE) ×6 IMPLANT
GLOVE BIOGEL PI IND STRL 6.5 (GLOVE) ×1 IMPLANT
GLOVE BIOGEL PI INDICATOR 6.5 (GLOVE) ×2
GOWN STRL REUS W/TWL LRG LVL3 (GOWN DISPOSABLE) ×9 IMPLANT
KIT ABG SYR 3ML LUER SLIP (SYRINGE) IMPLANT
NDL HYPO 25X5/8 SAFETYGLIDE (NEEDLE) IMPLANT
NEEDLE HYPO 25X5/8 SAFETYGLIDE (NEEDLE) IMPLANT
NS IRRIG 1000ML POUR BTL (IV SOLUTION) ×3 IMPLANT
PACK C SECTION WH (CUSTOM PROCEDURE TRAY) ×3 IMPLANT
PAD OB MATERNITY 4.3X12.25 (PERSONAL CARE ITEMS) ×3 IMPLANT
RTRCTR C-SECT PINK 25CM LRG (MISCELLANEOUS) IMPLANT
RTRCTR C-SECT PINK 34CM XLRG (MISCELLANEOUS) IMPLANT
STAPLER VISISTAT 35W (STAPLE) IMPLANT
STRIP CLOSURE SKIN 1/2X4 (GAUZE/BANDAGES/DRESSINGS) ×2 IMPLANT
SUT PDS AB 0 CT1 27 (SUTURE) ×6 IMPLANT
SUT PLAIN 0 NONE (SUTURE) IMPLANT
SUT VIC AB 0 CTX 36 (SUTURE) ×9
SUT VIC AB 0 CTX36XBRD ANBCTRL (SUTURE) ×3 IMPLANT
SUT VIC AB 2-0 CT1 27 (SUTURE) ×3
SUT VIC AB 2-0 CT1 TAPERPNT 27 (SUTURE) ×1 IMPLANT
SUT VIC AB 3-0 SH 27 (SUTURE)
SUT VIC AB 3-0 SH 27X BRD (SUTURE) IMPLANT
SUT VIC AB 4-0 KS 27 (SUTURE) ×3 IMPLANT
TOWEL OR 17X24 6PK STRL BLUE (TOWEL DISPOSABLE) ×3 IMPLANT
TRAY FOLEY CATH 14FR (SET/KITS/TRAYS/PACK) ×3 IMPLANT
WATER STERILE IRR 1000ML POUR (IV SOLUTION) ×3 IMPLANT

## 2014-11-23 SURGICAL SUPPLY — 31 items
BALLN POSTPARTUM SOS BAKRI (BALLOONS) ×6
BALLOON POSTPARTUM SOS BAKRI (BALLOONS) IMPLANT
CATH ROBINSON RED A/P 16FR (CATHETERS) ×3 IMPLANT
CLOSURE WOUND 1/2 X4 (GAUZE/BANDAGES/DRESSINGS) ×1
CLOTH BEACON ORANGE TIMEOUT ST (SAFETY) ×3 IMPLANT
COVER MAYO STAND STRL (DRAPES) ×2 IMPLANT
COVER TABLE BACK 60X90 (DRAPES) ×2 IMPLANT
DECANTER SPIKE VIAL GLASS SM (MISCELLANEOUS) ×3 IMPLANT
DRSG OPSITE POSTOP 4X10 (GAUZE/BANDAGES/DRESSINGS) ×2 IMPLANT
GLOVE BIOGEL M 6.5 STRL (GLOVE) ×6 IMPLANT
GLOVE BIOGEL PI IND STRL 6.5 (GLOVE) ×1 IMPLANT
GLOVE BIOGEL PI INDICATOR 6.5 (GLOVE) ×2
GOWN STRL REUS W/TWL LRG LVL3 (GOWN DISPOSABLE) ×6 IMPLANT
KIT BERKELEY 1ST TRIMESTER 3/8 (MISCELLANEOUS) ×3 IMPLANT
NS IRRIG 1000ML POUR BTL (IV SOLUTION) ×3 IMPLANT
PACK VAGINAL MINOR WOMEN LF (CUSTOM PROCEDURE TRAY) ×3 IMPLANT
PAD OB MATERNITY 4.3X12.25 (PERSONAL CARE ITEMS) ×1 IMPLANT
PAD PREP 24X48 CUFFED NSTRL (MISCELLANEOUS) ×3 IMPLANT
SET BERKELEY SUCTION TUBING (SUCTIONS) ×3 IMPLANT
SLEEVE SCD COMPRESS KNEE MED (MISCELLANEOUS) ×2 IMPLANT
SPONGE LAP 18X18 X RAY DECT (DISPOSABLE) ×4 IMPLANT
STRIP CLOSURE SKIN 1/2X4 (GAUZE/BANDAGES/DRESSINGS) ×1 IMPLANT
SUT CHROMIC 1 CT 27 (SUTURE) ×2 IMPLANT
SUT VIC AB 3-0 CT1 27 (SUTURE) ×3
SUT VIC AB 3-0 CT1 TAPERPNT 27 (SUTURE) IMPLANT
SUT VIC AB 3-0 CTX 36 (SUTURE) ×2 IMPLANT
TOWEL OR 17X24 6PK STRL BLUE (TOWEL DISPOSABLE) ×6 IMPLANT
VACURETTE 10 RIGID CVD (CANNULA) IMPLANT
VACURETTE 7MM CVD STRL WRAP (CANNULA) IMPLANT
VACURETTE 8 RIGID CVD (CANNULA) IMPLANT
VACURETTE 9 RIGID CVD (CANNULA) IMPLANT

## 2014-11-23 NOTE — Plan of Care (Signed)
Problem: Phase I Progression Outcomes Goal: FHR checked 5 minutes after meds (ROM) Rupture of Membranes Outcome: Completed/Met Date Met:  11/23/14

## 2014-11-23 NOTE — Anesthesia Postprocedure Evaluation (Signed)
  Anesthesia Post-op Note  Anesthesia Post Note  Patient: Penny Abbott  Procedure(s) Performed: Procedure(s) (LRB): Dilatation and Evacuation with Ultrasound Guidance, Attempted Bakri Balloon Insertion  (N/A)  Anesthesia type: Spinal  Patient location: PACU  Post pain: Pain level controlled  Post assessment: Post-op Vital signs reviewed  Last Vitals:  Filed Vitals:   11/23/14 2303  BP: 114/45  Pulse: 96  Temp: 38.9 C  Resp: 20    Post vital signs: Reviewed  Level of consciousness: awake  Complications: No apparent anesthesia complications

## 2014-11-23 NOTE — Anesthesia Preprocedure Evaluation (Addendum)
Anesthesia Evaluation  Patient identified by MRN, date of birth, ID band Patient awake    Reviewed: Allergy & Precautions, H&P , NPO status , Patient's Chart, lab work & pertinent test results, reviewed documented beta blocker date and time   History of Anesthesia Complications Negative for: history of anesthetic complications  Airway Mallampati: III  TM Distance: >3 FB Neck ROM: full    Dental  (+) Teeth Intact   Pulmonary neg pulmonary ROS,  breath sounds clear to auscultation        Cardiovascular negative cardio ROS  Rhythm:regular Rate:Normal + Systolic murmurs    Neuro/Psych negative neurological ROS  negative psych ROS   GI/Hepatic negative GI ROS, Neg liver ROS,   Endo/Other  negative endocrine ROS  Renal/GU negative Renal ROS     Musculoskeletal   Abdominal   Peds  Hematology negative hematology ROS (+) plt 136   Anesthesia Other Findings   Reproductive/Obstetrics (+) Pregnancy                             Anesthesia Physical Anesthesia Plan  ASA: II and emergent  Anesthesia Plan: Epidural   Post-op Pain Management:    Induction:   Airway Management Planned: Natural Airway  Additional Equipment:   Intra-op Plan:   Post-operative Plan:   Informed Consent: I have reviewed the patients History and Physical, chart, labs and discussed the procedure including the risks, benefits and alternatives for the proposed anesthesia with the patient or authorized representative who has indicated his/her understanding and acceptance.     Plan Discussed with: Anesthesiologist, CRNA and Surgeon  Anesthesia Plan Comments: (Patient for C/Section for failure to progress. Will use epidural for C/Section.)       Anesthesia Quick Evaluation

## 2014-11-23 NOTE — Progress Notes (Signed)
Dr. Richardson Doppole notified of patient's assessment findings.  Increase in bleeding, saturation of small peri pad onto larger blue pad in one hour, trickling before massage, no clots at this time, temperature of 101.5, uterus still midline at umbilicus and pain score of a "zero".  Patient continues to refuse full fundal assessment by RN.  Dr. Richardson Doppole to come assess patient in the room momentarily.   Cox, Marcio Hoque M

## 2014-11-23 NOTE — Transfer of Care (Signed)
Immediate Anesthesia Transfer of Care Note  Patient: Penny Abbott  Procedure(s) Performed: Procedure(s): CESAREAN SECTION (N/A)  Patient Location: PACU  Anesthesia Type:Epidural  Level of Consciousness: awake, alert  and oriented  Airway & Oxygen Therapy: Patient Spontanous Breathing  Post-op Assessment: Report given to PACU RN  Post vital signs: Reviewed  Complications: No apparent anesthesia complications

## 2014-11-23 NOTE — Anesthesia Preprocedure Evaluation (Addendum)
Anesthesia Evaluation  Patient identified by MRN, date of birth, ID band Patient awake    Reviewed: Allergy & Precautions, H&P , NPO status , Patient's Chart, lab work & pertinent test results, reviewed documented beta blocker date and time   History of Anesthesia Complications Negative for: history of anesthetic complications  Airway Mallampati: III  TM Distance: >3 FB Neck ROM: full    Dental  (+) Teeth Intact   Pulmonary neg pulmonary ROS,  breath sounds clear to auscultation  Pulmonary exam normal       Cardiovascular negative cardio ROS  Rhythm:regular Rate:Normal + Systolic murmurs    Neuro/Psych negative neurological ROS  negative psych ROS   GI/Hepatic negative GI ROS, Neg liver ROS,   Endo/Other  negative endocrine ROS  Renal/GU negative Renal ROS     Musculoskeletal   Abdominal (+) + obese,   Peds  Hematology negative hematology ROS (+) anemia , plt 136   Anesthesia Other Findings   Reproductive/Obstetrics Post partum hemorrhage                             Anesthesia Physical  Anesthesia Plan  ASA: II and emergent  Anesthesia Plan: Spinal   Post-op Pain Management:    Induction:   Airway Management Planned: Natural Airway  Additional Equipment:   Intra-op Plan:   Post-operative Plan:   Informed Consent: I have reviewed the patients History and Physical, chart, labs and discussed the procedure including the risks, benefits and alternatives for the proposed anesthesia with the patient or authorized representative who has indicated his/her understanding and acceptance.     Plan Discussed with: Anesthesiologist, CRNA and Surgeon  Anesthesia Plan Comments: (Patient for C/Section for failure to progress. Will use epidural for C/Section.)       Anesthesia Quick Evaluation

## 2014-11-23 NOTE — Plan of Care (Signed)
Problem: Phase I Progression Outcomes Goal: Assess per MD/Nurse,Routine-VS,FHR,UC,Head to Toe assess Outcome: Completed/Met Date Met:  11/23/14 Goal: Obtain and review prenatal records Outcome: Completed/Met Date Met:  11/23/14 Goal: Pain controlled with appropriate interventions Outcome: Completed/Met Date Met:  11/23/14 Goal: OOB as tolerated unless otherwise ordered Outcome: Completed/Met Date Met:  11/23/14 Goal: Medications/IV Fluids N/A Outcome: Completed/Met Date Met:  11/23/14 Goal: IV Pain medications as ordered Outcome: Completed/Met Date Met:  11/23/14 Goal: Complete instrument count Outcome: Completed/Met Date Met:  11/23/14

## 2014-11-23 NOTE — Plan of Care (Signed)
Problem: Consults Goal: Postpartum Patient Education (See Patient Education module for education specifics.)  Outcome: Completed/Met Date Met:  11/23/14 Goal: Skin Care Protocol Initiated - if Braden Score 18 or less If consults are not indicated, leave blank or document N/A  Outcome: Completed/Met Date Met:  11/23/14 Goal: Nutrition Consult-if indicated Outcome: Not Applicable Date Met:  39/58/44  Problem: Phase I Progression Outcomes Goal: Pain controlled with appropriate interventions Outcome: Completed/Met Date Met:  11/23/14 Goal: Initial discharge plan identified Outcome: Completed/Met Date Met:  11/23/14  Problem: Phase II Progression Outcomes Goal: Pain controlled on oral analgesia Outcome: Completed/Met Date Met:  11/23/14 Goal: Progress activity as tolerated unless otherwise ordered Outcome: Completed/Met Date Met:  11/23/14

## 2014-11-23 NOTE — Anesthesia Procedure Notes (Signed)
Epidural Patient location during procedure: OB Start time: 11/23/2014 2:45 AM  Staffing Anesthesiologist: Kelli Robeck Performed by: anesthesiologist   Preanesthetic Checklist Completed: patient identified, site marked, surgical consent, pre-op evaluation, timeout performed, IV checked, risks and benefits discussed and monitors and equipment checked  Epidural Patient position: sitting Prep: site prepped and draped and DuraPrep Patient monitoring: continuous pulse ox and blood pressure Approach: midline Location: L3-L4 Injection technique: LOR air  Needle:  Needle type: Tuohy  Needle gauge: 17 G Needle length: 9 cm and 9 Needle insertion depth: 5 cm cm Catheter type: closed end flexible Catheter size: 19 Gauge Catheter at skin depth: 10 cm Test dose: negative  Assessment Events: blood not aspirated, injection not painful, no injection resistance, negative IV test and no paresthesia  Additional Notes Discussed risk of headache, infection, bleeding, nerve injury and failed or incomplete block.  Patient voices understanding and wishes to proceed.  Epidural placed easily on first attempt.  No paresthesia. Patient tolerated procedure well with no apparent complications  A. Rodman Pickleassidy, MDReason for block:procedure for pain

## 2014-11-23 NOTE — Op Note (Signed)
Cesarean Section Procedure Note  Indications: failure to progress: arrest of dilation  Pre-operative Diagnosis: 39 week 3 day pregnancy.  Post-operative Diagnosis: same + Uterine Atony   Surgeon: Jessee AversOLE,Americo Vallery J.   Assistants: None  Anesthesia: Epidural anesthesia  ASA Class: 2   Procedure Details   The patient was seen in the Holding Room. The risks, benefits, complications, treatment options, and expected outcomes were discussed with the patient.  The patient concurred with the proposed plan, giving informed consent.  The site of surgery properly noted/marked. The patient was taken to Operating Room # 1, identified as Penny Abbott and the procedure verified as C-Section Delivery. A Time Out was held and the above information confirmed.  After induction of anesthesia, the patient was draped and prepped in the usual sterile manner. A Pfannenstiel incision was made and carried down through the subcutaneous tissue to the fascia. Fascial incision was made and extended transversely. The fascia was separated from the underlying rectus tissue superiorly and inferiorly. The peritoneum was identified and entered. Peritoneal incision was extended longitudinally. The utero-vesical peritoneal reflection was incised transversely and the bladder flap was bluntly freed from the lower uterine segment. A low transverse uterine incision was made. Delivered from cephalic presentation was a Female with Apgar scores of 9 at one minute and 9 at five minutes. After the umbilical cord was clamped and cut cord blood was obtained for evaluation. The placenta was removed intact and appeared normal. The uterine outline, tubes and ovaries appeared normal. The uterine incision was closed with running locked sutures of 0 vicryl. A second layer of 0 vicryl was used to imbricate the incsion.. pt was noted to have uterine atony and 0.2 mg of methergine was given IM. Marland Kitchen. Hemostasis was observed. Lavage was carried out until clear.  Interseed was placed along the uterine incision. The fascia was then reapproximated with running sutures of 0 pds. The skin was reapproximated with 4-0 vicryl .  Instrument, sponge, and needle counts were correct prior the abdominal closure and at the conclusion of the case.   Findings: Female infant in the cephalic presentation   Estimated Blood Loss:  700 mL          Drains: Foley catheter.... UOP 425 cc          Total IV Fluids:  2700 ml         Specimens: Placenta and sent to labor and delivery            Implants: None         Complications:  None; patient tolerated the procedure well.         Disposition: PACU - hemodynamically stable.         Condition: stable  Attending Attestation: I performed the procedure.

## 2014-11-23 NOTE — Progress Notes (Signed)
Subjective: Postpartum Day 0: Cesarean Delivery In to see patient due to report of postpartum bleeding. Pt would not allow nurses to check for bleeding effectively. Pt with temp of 101.5. She received methergine at the time of cesarean section . As well as 1000 mcg of cytotec and 1 amp of hemabate in PACU.Marland Kitchen. She received pitocin as well. She has continued to have intermittent bleeding.    Objective: Vital signs in last 24 hours: Temp:  [97.9 F (36.6 C)-102 F (38.9 C)] 102 F (38.9 C) (11/24 2303) Pulse Rate:  [69-126] 96 (11/24 2303) Resp:  [15-31] 20 (11/24 2303) BP: (101-142)/(41-98) 114/45 mmHg (11/24 2303) SpO2:  [95 %-100 %] 97 % (11/24 2303)  Physical Exam:  General: alert and cooperative Lochia: inappropriate Uterine Fundus: firm but clots expressed with fundal massage approximately 400 cc of blood expressed with massage. Active bleeding continuing.  Incision: no significant drainage DVT Evaluation: No evidence of DVT seen on physical exam.   Recent Labs  11/23/14 1402 11/23/14 2017  HGB 12.1 9.4*  HCT 35.8* 27.7*    Assessment/Plan: Status post Cesarean section. Postoperative course complicated by  postpatum hemorrhage... plan for exam under anesthesia/ D&C under ultrasound guidance and placement of bakri balloon.. possible laparotomy with possible hysterectomy... d/w pt r/b/a of surgery including but not limited to infection bleeding damage to uterus and surrounding organs witht the need for further surgery. r/o transfusion discussed. pt voiced understanding and desires to proceed.   Jessee Avers.  Ahamed Hofland J. 11/23/2014, 11:30 PM

## 2014-11-23 NOTE — Anesthesia Postprocedure Evaluation (Signed)
  Anesthesia Post-op Note  Patient: Penny Abbott  Procedure(s) Performed: Procedure(s): CESAREAN SECTION (N/A)  Patient Location: PACU  Anesthesia Type:Epidural  Level of Consciousness: awake, alert  and oriented  Airway and Oxygen Therapy: Patient Spontanous Breathing  Post-op Pain: none  Post-op Assessment: Post-op Vital signs reviewed, Patient's Cardiovascular Status Stable, Respiratory Function Stable, Patent Airway, No signs of Nausea or vomiting, Pain level controlled, No headache and No backache  Post-op Vital Signs: Reviewed and stable  Last Vitals:  Filed Vitals:   11/23/14 1315  BP: 107/82  Pulse: 69  Temp:   Resp: 16    Complications: No apparent anesthesia complications

## 2014-11-23 NOTE — Progress Notes (Signed)
Penny Abbott is a 23 y.o. G1P0 at 2847w3d  admitted for active labor  Subjective:  pt exhaused from pushing 3 huors   Objective: BP 127/60 mmHg  Pulse 86  Temp(Src) 99.2 F (37.3 C) (Oral)  Resp 16  Ht 5\' 6"  (1.676 m)  Wt 91.627 kg (202 lb)  BMI 32.62 kg/m2  SpO2 96%  LMP 02/20/2014 I/O last 3 completed shifts: In: -  Out: 250 [Urine:250]    FHT:  FHR: 145 bpm, variability: moderate,  accelerations:  Present,  decelerations:  Present variable  UC:   regular, every 2 minutes SVE:   Dilation: 10 Effacement (%): 100 Station: +2 Exam by:: Penny Doppole MD  Labs: Lab Results  Component Value Date   WBC 14.8* 11/23/2014   HGB 12.9 11/23/2014   HCT 38.0 11/23/2014   MCV 94.5 11/23/2014   PLT 134* 11/23/2014    Assessment / Plan: Arrest of decent recommend cesarean section r/b/a of cesarean section discussed with patient including but not limited to infection bleeding damage to bowel bladder baby with the need for further surgery. R/o transfusion HIV/ hepB&C discussed pt voiced understanding and desires to proceed with cesarean section.     Penny Abbott J. 11/23/2014, 10:31 AM

## 2014-11-23 NOTE — Anesthesia Procedure Notes (Signed)
Spinal Patient location during procedure: OR Start time: 11/23/2014 7:18 PM Staffing Anesthesiologist: Romon Devereux A. Performed by: anesthesiologist  Preanesthetic Checklist Completed: patient identified, site marked, surgical consent, pre-op evaluation, timeout performed, IV checked, risks and benefits discussed and monitors and equipment checked Spinal Block Patient position: sitting Prep: site prepped and draped and DuraPrep Patient monitoring: heart rate, cardiac monitor, continuous pulse ox and blood pressure Approach: midline Location: L3-4 Injection technique: single-shot Needle Needle type: Sprotte  Needle gauge: 24 G Needle length: 9 cm Needle insertion depth: 6 cm Assessment Sensory level: T8 Additional Notes Patient tolerated procedure well. Adequate sensory level.

## 2014-11-23 NOTE — Transfer of Care (Signed)
Immediate Anesthesia Transfer of Care Note  Patient: Penny Abbott  Procedure(s) Performed: Procedure(s): Dilatation and Evacuation with Ultrasound Guidance, Attempted Bakri Balloon Insertion  (N/A)  Patient Location: PACU  Anesthesia Type:MAC and Spinal  Level of Consciousness: awake, alert  and oriented  Airway & Oxygen Therapy: Patient Spontanous Breathing and Patient connected to nasal cannula oxygen  Post-op Assessment: Report given to PACU RN and Post -op Vital signs reviewed and stable  Post vital signs: Reviewed and stable  Complications: No apparent anesthesia complications

## 2014-11-23 NOTE — H&P (Signed)
Penny Abbott is a 23 y.o. female G1 at 3739  3/7 weeks presenting for c/o contractions x 4 hours.  Upon arrival contractions were every 5 minutes, strong.  Denied LOF or VB.  Cervix was 3/90 and changed to 3.5/100/buldging bag.  Contractions were 8/10.  While ambulating, pt would have to stop and breath through contractions per RN. Currently pt is comfortable s/p epidural.  Denies pelvic pressure. Pt has had prenatal care with Dr. Richardson Abbott since the first trimester and pregnancy has been uncomplicated.  GBS negative.  Maternal Medical History:  Reason for admission: Contractions.   Contractions: Frequency: regular.   Perceived severity is strong.   Started 4 hours prior to admission.  Prenatal complications: no prenatal complications Prenatal Complications - Diabetes: none.    OB History    Gravida Para Term Preterm AB TAB SAB Ectopic Multiple Living   1              Past Medical History  Diagnosis Date  . Medical history non-contributory    Past Surgical History  Procedure Laterality Date  . Achilles tendon repair     Family History: family history is not on file. Social History:  reports that she has never smoked. She has never used smokeless tobacco. She reports that she does not drink alcohol or use illicit drugs.   Prenatal Transfer Tool  Maternal Diabetes: No Genetic Screening: Declined Maternal Ultrasounds/Referrals: Normal Fetal Ultrasounds or other Referrals:  None Maternal Substance Abuse:  No Significant Maternal Medications:  None Significant Maternal Lab Results:  Lab values include: Group B Strep negative Other Comments:  None  Review of Systems  Eyes: Negative.   Gastrointestinal: Positive for abdominal pain.  Neurological: Positive for headaches.    Dilation: 6.5 Effacement (%): 100 Station: -1 Exam by:: Dr. Gunnar Abbott Blood pressure 118/54, pulse 81, temperature 97.9 F (36.6 C), temperature source Oral, resp. rate 16, height 5\' 6"  (1.676 m), weight  91.627 kg (202 lb), last menstrual period 02/20/2014, SpO2 96 %. Maternal Exam:  Uterine Assessment: Contraction strength is firm.  Contraction frequency is regular.   Abdomen: Estimated fetal weight is 7-7.5 lbs.   Fetal presentation: vertex  Introitus: Normal vulva. Amniotic fluid character: clear.  Pelvis: questionable for delivery.   Cervix: Cervix evaluated by digital exam.     Fetal Exam Fetal Monitor Review: Mode: fetoscope.   Baseline rate: 120.  Variability: moderate (6-25 bpm).   Pattern: accelerations present and no decelerations.    Fetal State Assessment: Category I - tracings are normal.     Physical Exam  Constitutional: She is oriented to person, place, and time. She appears well-developed and well-nourished.  HENT:  Head: Normocephalic and atraumatic.  Eyes: EOM are normal.  Neck: Normal range of motion.  Cardiovascular: Normal rate, regular rhythm and normal heart sounds.   Respiratory: Effort normal and breath sounds normal. No respiratory distress.  GI: There is no tenderness.  Genitourinary: Vagina normal.  Musculoskeletal: Normal range of motion. She exhibits edema. She exhibits no tenderness.  Neurological: She is alert and oriented to person, place, and time.  Skin: Skin is warm and dry.  Psychiatric: She has a normal mood and affect.    Bloody show. Buldging bag.  AROM clear.    Prenatal labs: ABO, Rh: --/--/A POS, A POS (11/23 2250) Antibody: NEG (11/23 2250) Rubella:   RPR: Nonreactive (08/25 0000)  HBsAg:    HIV: Non-reactive (04/20 0000)  GBS: Negative (10/27 0000)   Assessment/Plan: IUP at 39  3/7 weeks, Active labor. S/p AROM.  Progressing normally. Category I tracing. Platelets decreased slightly.  BP normal.  Additional labs ordered. Questionable pelvis for delivery.  Monitor progress closely. Epidural in place. Dr. Richardson Abbott to assume care after 7 am.  Penny RankinsVARNADO, Penny Abbott 11/23/2014, 5:06 AM

## 2014-11-23 NOTE — Progress Notes (Signed)
Increased bleeding noted to peri pads.  Two small pads and one large pad saturated for estimate of 250mL.  Patient refused to let me massage her uterus.  Explained the urgent need to massage the uterus and observe bleeding.  At this time uterus is firm and at the umbilicus with no trickling of blood or passing of clots.  Changed peri pads and will reassess bleeding and blood loss at the next assessment in one hour.  Patient stated pain level of "zero" at this time.   Cox, Kipton Skillen M

## 2014-11-23 NOTE — Anesthesia Postprocedure Evaluation (Signed)
  Anesthesia Post-op Note  Patient: Penny Abbott  Procedure(s) Performed: * No procedures listed *  Patient Location: PACU and Mother/Baby  Anesthesia Type:Epidural  Level of Consciousness: awake, alert  and oriented  Airway and Oxygen Therapy: Patient Spontanous Breathing  Post-op Pain: mild  Post-op Assessment: Post-op Vital signs reviewed  Post-op Vital Signs: Reviewed and stable  Last Vitals:  Filed Vitals:   11/23/14 1800  BP: 131/67  Pulse: 102  Temp: 38.6 C  Resp: 20    Complications: No apparent anesthesia complications

## 2014-11-23 NOTE — Progress Notes (Signed)
Dr. Richardson Doppole at bedside explaining risks and benefits of delivery via C/S d/t failure to descend.  Patient verbalizes consent for procedure.

## 2014-11-23 NOTE — Op Note (Addendum)
11/22/2014 - 11/23/2014  11:39 PM  PATIENT:  Penny Abbott  23 y.o. female  PRE-OPERATIVE DIAGNOSIS:  post partum hemorrhage  POST-OPERATIVE DIAGNOSIS:  post partum hemorrhage,  Uterine atony , Possible Retained products of conception PROCEDURE:  Procedure(s): Dilatation and Evacuation with Ultrasound Guidance, Attempted Bakri Balloon Insertion  (N/A)  SURGEON:  Surgeon(s) and Role:    * Maclain Cohron J. Richardson Doppole, MD - Primary    * Jaymes GraffNaima Dillard, MD - Assisting    * Catalina AntiguaPeggy Constant, MD - Assisting   ANESTHESIA:   spinal  EBL:  Total I/O In: 2600 [I.V.:2600] Out: 1275 [Urine:275; Blood:1000]  BLOOD ADMINISTERED:none  DRAINS: Urinary Catheter (Foley)   LOCAL MEDICATIONS USED:  NONE  SPECIMEN:  Source of Specimen:  possible retained products   DISPOSITION OF SPECIMEN:  PATHOLOGY  COUNTS:  YES  TOURNIQUET:  * No tourniquets in log *  DICTATION: .Dragon Dictation  PLAN OF CARE: transfer to postpartum unit   PATIENT DISPOSITION:  PACU - hemodynamically stable.   Delay start of Pharmacological VTE agent (>24hrs) due to surgical blood loss or risk of bleeding: not applicable  Procedure: Patient was taken to OR room 4 where spinal anesthetic was placed. Time out was performed and patient was identified as Penny Abbott. She was placed in the dorsal lithotomy position. She was prepped and draped in the normal sterile fashion. Speculum was placed. A small laceration was noted on the left vaginal side wall this was repaired with 3-0 vicryl. Visualization was difficult. And a second assistant was needed to aid with visualization. Weighted speculum was placed.  As well as dever and side wall retractors to aid with visualization. Dr. Catalina AntiguaPeggy Constant arrived to assist with visualization. She was relieved by Dr. Jaymes GraffNaima Dillard. Clotting was noted at the anterior uterine segment via ultrasound. The cervix was grasped with ring forceps. Banjo Currette was introduced. And currettage was performed.  Patient continued to have bleeding from the uterus. The Bakri balloon was placed inside the endometrium and followed to the fundus. It was filled with sterile saline. Once 200 cc was in the balloon began to migrate to the cervix and was expelled.The saline was removed from the balloon and placement was attempted again. The balloon was expelled int the cervix again after it was filled with 300 cc of sterile saline. It was allowed to stay in the cervix and lower uterine segment for 5-10 minutes in an attempt to tamponade the bleeding from the lower uterine segment. The saline was removed and the balloon was removed. Bleeding from the uterus had subsided significantly. Pt was noted to have superficial bleeding from the right pelvic side wall. A suture of 3-0 vicryl was placed and hemostasis was observed.  The uterine atony appeared to have resolved. All instruments were removed from the vagina. The patient was taken to the recovery room awake and in stable condition.

## 2014-11-24 ENCOUNTER — Encounter (HOSPITAL_COMMUNITY): Payer: Self-pay | Admitting: Obstetrics and Gynecology

## 2014-11-24 LAB — CBC
HCT: 20.8 % — ABNORMAL LOW (ref 36.0–46.0)
HCT: 20.1 % — ABNORMAL LOW (ref 36.0–46.0)
HEMOGLOBIN: 6.9 g/dL — AB (ref 12.0–15.0)
Hemoglobin: 7.1 g/dL — ABNORMAL LOW (ref 12.0–15.0)
MCH: 32.3 pg (ref 26.0–34.0)
MCH: 32.5 pg (ref 26.0–34.0)
MCHC: 34.1 g/dL (ref 30.0–36.0)
MCHC: 34.3 g/dL (ref 30.0–36.0)
MCV: 94.5 fL (ref 78.0–100.0)
MCV: 94.8 fL (ref 78.0–100.0)
PLATELETS: 98 10*3/uL — AB (ref 150–400)
Platelets: 94 10*3/uL — ABNORMAL LOW (ref 150–400)
RBC: 2.2 MIL/uL — ABNORMAL LOW (ref 3.87–5.11)
RBC: 2.12 MIL/uL — AB (ref 3.87–5.11)
RDW: 14.5 % (ref 11.5–15.5)
RDW: 14.6 % (ref 11.5–15.5)
WBC: 20.6 10*3/uL — AB (ref 4.0–10.5)
WBC: 21.2 10*3/uL — ABNORMAL HIGH (ref 4.0–10.5)

## 2014-11-24 LAB — BIRTH TISSUE RECOVERY COLLECTION (PLACENTA DONATION)

## 2014-11-24 MED ORDER — KETOROLAC TROMETHAMINE 30 MG/ML IJ SOLN
INTRAMUSCULAR | Status: AC
  Filled 2014-11-24: qty 5

## 2014-11-24 MED ORDER — KETOROLAC TROMETHAMINE 30 MG/ML IJ SOLN
INTRAMUSCULAR | Status: AC
  Filled 2014-11-24: qty 1

## 2014-11-24 NOTE — Progress Notes (Signed)
Subjective: Postpartum Day 1: Cesarean Delivery and return to OR for D&C underultrasound guidance due to postpartum hemorrhage Patient reports tolerating PO and + flatus.   Has pruritus   Objective: Vital signs in last 24 hours: Temp:  [98.4 F (36.9 C)-102 F (38.9 C)] 98.7 F (37.1 C) (11/25 0800) Pulse Rate:  [69-126] 70 (11/25 0800) Resp:  [15-31] 19 (11/25 0800) BP: (83-137)/(40-98) 91/40 mmHg (11/25 0800) SpO2:  [95 %-100 %] 98 % (11/25 0800)  Physical Exam:  General: alert and cooperative Lochia: appropriate ( Current pad has been on for 4 hours and has quarter spot Uterine Fundus: firm Incision: healing well DVT Evaluation: No evidence of DVT seen on physical exam.   Recent Labs  11/23/14 2017 11/24/14 0645  HGB 9.4* 7.1*  HCT 27.7* 20.8*    Assessment/Plan: Status post Cesarean section. And return to OR D&C and attempted bakri balloon for postpartum hemorrhage Lochia is scant currently. Continue methergine 0.2 mg  q 4 hours for 6 doses Anemia.. Pt is currently asymptomatic however she has not gotten up to ambulate. Plan to repeat hgb at 1100 this am  Endometritis: Continue unasyn and clindamycin until 48 hours afebrile plan repeat cbc in am  Dr. Charlotta Newtonzan covering after 1 pm today.  Dr. Su Hiltoberts covering after 7 pm today.   Kenai Fluegel J. 11/24/2014, 11:06 AM

## 2014-11-24 NOTE — Lactation Note (Signed)
This note was copied from the chart of Penny Abbott. Lactation Consultation Note  Patient Name: Penny Dietrich PatesCourtnee Onken ZOXWR'UToday's Date: 11/24/2014 Reason for consult: Initial assessment  Baby is 925 hours old and mom is a 1st time mother and breast feeder. Mom  Has a significant blood loss and per mom is feeling very tired.  LC assisted with latch in football right breast , several swallows noted, Increased with breast compressions.  Patient's mother is at bedside and very supportive. Mother informed of post-discharge support and given phone number to the lactation department,  including services for phone call assistance; out-patient appointments; and breastfeeding support group.  List of other breastfeeding resources in the community given in the handout. Encouraged mother to call for  problems or concerns related to breastfeeding.   Maternal Data Has patient been taught Hand Expression?: Yes  Feeding Feeding Type: Breast Fed Length of feed: 7 min (intermittent pattern with swallows )  LATCH Score/Interventions Latch: Grasps breast easily, tongue down, lips flanged, rhythmical sucking. Intervention(s): Adjust position;Assist with latch;Breast massage;Breast compression  Audible Swallowing: A few with stimulation  Type of Nipple: Everted at rest and after stimulation  Comfort (Breast/Nipple): Soft / non-tender     Hold (Positioning): Assistance needed to correctly position infant at breast and maintain latch. Intervention(s): Breastfeeding basics reviewed;Support Pillows;Position options;Skin to skin  LATCH Score: 8  Lactation Tools Discussed/Used WIC Program: Yes   Consult Status Consult Status: Follow-up Date: 11/25/14 Follow-up type: In-patient    Kathrin Greathouseorio, Ayvin Lipinski Ann 11/24/2014, 12:18 PM

## 2014-11-24 NOTE — Plan of Care (Signed)
Problem: Phase I Progression Outcomes Goal: Foley catheter patent Outcome: Completed/Met Date Met:  11/24/14

## 2014-11-24 NOTE — Plan of Care (Signed)
Problem: Discharge Progression Outcomes Goal: Tolerating diet Outcome: Completed/Met Date Met:  11/24/14

## 2014-11-25 LAB — CBC WITH DIFFERENTIAL/PLATELET
Basophils Absolute: 0 10*3/uL (ref 0.0–0.1)
Basophils Relative: 0 % (ref 0–1)
EOS PCT: 1 % (ref 0–5)
Eosinophils Absolute: 0.1 10*3/uL (ref 0.0–0.7)
HEMATOCRIT: 16.8 % — AB (ref 36.0–46.0)
HEMOGLOBIN: 5.7 g/dL — AB (ref 12.0–15.0)
LYMPHS ABS: 2.4 10*3/uL (ref 0.7–4.0)
LYMPHS PCT: 13 % (ref 12–46)
MCH: 32.2 pg (ref 26.0–34.0)
MCHC: 33.9 g/dL (ref 30.0–36.0)
MCV: 94.9 fL (ref 78.0–100.0)
MONO ABS: 2.9 10*3/uL — AB (ref 0.1–1.0)
MONOS PCT: 16 % — AB (ref 3–12)
Neutro Abs: 12.8 10*3/uL — ABNORMAL HIGH (ref 1.7–7.7)
Neutrophils Relative %: 71 % (ref 43–77)
Platelets: 93 10*3/uL — ABNORMAL LOW (ref 150–400)
RBC: 1.77 MIL/uL — AB (ref 3.87–5.11)
RDW: 14.8 % (ref 11.5–15.5)
WBC: 18.2 10*3/uL — AB (ref 4.0–10.5)

## 2014-11-25 LAB — PREPARE RBC (CROSSMATCH)

## 2014-11-25 MED ORDER — ACETAMINOPHEN 325 MG PO TABS
650.0000 mg | ORAL_TABLET | Freq: Once | ORAL | Status: AC
  Administered 2014-11-25: 650 mg via ORAL
  Filled 2014-11-25: qty 2

## 2014-11-25 MED ORDER — SODIUM CHLORIDE 0.9 % IV SOLN
Freq: Once | INTRAVENOUS | Status: AC
  Administered 2014-11-25: 09:00:00 via INTRAVENOUS

## 2014-11-25 MED ORDER — FUROSEMIDE 10 MG/ML IJ SOLN
20.0000 mg | Freq: Once | INTRAMUSCULAR | Status: AC
  Administered 2014-11-25: 20 mg via INTRAVENOUS
  Filled 2014-11-25: qty 2

## 2014-11-25 MED ORDER — DIPHENHYDRAMINE HCL 50 MG/ML IJ SOLN
25.0000 mg | Freq: Once | INTRAMUSCULAR | Status: AC
  Administered 2014-11-25: 25 mg via INTRAVENOUS
  Filled 2014-11-25: qty 1

## 2014-11-25 NOTE — Lactation Note (Signed)
This note was copied from the chart of Penny Abbott. Lactation Consultation Note Follow up visit at 59 hours of age.  Baby has fed 8 times in 24 hours with last stool >24 hours ago.  Mom reports hearing swallows although baby gets sleepy at the breast.  Baby awakened and placed STS in football hold.  Baby latches well with minimal assist.  Baby maintains wide flanged lips and rhythmic sucking, swallows heard.  Baby is noted to rotate head to left while latched to breast.  Encouraged mom to keep baby deeply latched with chin, cheeks and nose touching breast.  Encouraged mom to have nurse observe feeding for latch score later.  Mom to call for assist as needed.  MBU RN aware of decreased output.   Patient Name: Penny Dietrich PatesCourtnee Mehta OZHYQ'MToday's Date: 11/25/2014 Reason for consult: Follow-up assessment   Maternal Data Has patient been taught Hand Expression?: Yes  Feeding Feeding Type: Breast Fed Length of feed:  (observed several minutes and swallows)  LATCH Score/Interventions Latch: Grasps breast easily, tongue down, lips flanged, rhythmical sucking. Intervention(s): Adjust position;Assist with latch;Breast massage;Breast compression  Audible Swallowing: Spontaneous and intermittent  Type of Nipple: Everted at rest and after stimulation  Comfort (Breast/Nipple): Soft / non-tender     Hold (Positioning): Assistance needed to correctly position infant at breast and maintain latch. Intervention(s): Skin to skin;Position options;Support Pillows;Breastfeeding basics reviewed  LATCH Score: 9  Lactation Tools Discussed/Used     Consult Status Consult Status: Follow-up Date: 11/26/14 Follow-up type: In-patient    Jannifer RodneyShoptaw, Jana Lynn 11/25/2014, 10:15 PM

## 2014-11-25 NOTE — Plan of Care (Signed)
Problem: Phase II Progression Outcomes Goal: Incision intact & without signs/symptoms of infection Outcome: Completed/Met Date Met:  11/25/14

## 2014-11-25 NOTE — Progress Notes (Signed)
CRITICAL VALUE ALERT  Critical value received: Hemoglobin 5.7  Date of notification:  11/25/2014  Time of notification:  0700  Critical value read back:Yes.    Nurse who received alert:  Leland Johnshaih Eowyn Tabone  MD notified (1st page):  Dr. Richardson Doppole  Time of first page: 0735  MD notified (2nd page):  Time of second page:  Responding MD:  [phone]  Time MD responded:  MD is making round to see the patient.

## 2014-11-25 NOTE — Plan of Care (Signed)
Problem: Phase I Progression Outcomes Goal: Voiding adequately Outcome: Completed/Met Date Met:  11/25/14  Problem: Phase II Progression Outcomes Goal: Tolerating diet Outcome: Completed/Met Date Met:  11/25/14

## 2014-11-25 NOTE — Plan of Care (Signed)
Problem: Phase I Progression Outcomes Goal: OOB as tolerated unless otherwise ordered Outcome: Completed/Met Date Met:  11/25/14     

## 2014-11-25 NOTE — Progress Notes (Signed)
Subjective: Postpartum Day 2: Cesarean Delivery and D&C for postpartum hemorrhage  Patient reports incisional pain and tolerating PO.  Feels very tired. Has not voided yet.   Objective: Vital signs in last 24 hours: Temp:  [97.9 F (36.6 C)-100.4 F (38 C)] 97.9 F (36.6 C) (11/26 0345) Pulse Rate:  [70-120] 88 (11/26 0345) Resp:  [18-20] 20 (11/26 0345) BP: (91-109)/(39-64) 102/48 mmHg (11/26 0345) SpO2:  [98 %-99 %] 98 % (11/26 0110)  UOP 250 cc over last 7 hours   Physical Exam:  General: alert and cooperative Lochia: appropriate Uterine Fundus: firm Incision: healing well DVT Evaluation: No evidence of DVT seen on physical exam.   Recent Labs  11/24/14 1110 11/25/14 0615  HGB 6.9* 5.7*  HCT 20.1* 16.8*    Assessment/Plan: Status post Cesarean section. Postoperative course complicated by postpartum hemorrhage/ anemia/ endometritis   Hgb continues to decline pt is symptomatic recommend transfusion of 2 units pRBC will check hgb 4 hours after transfusion and in AM Endometritis- continue unasyn and clindamycin  Will continue to monitor closely .  Berneda Piccininni J. 11/25/2014, 7:53 AM

## 2014-11-26 ENCOUNTER — Ambulatory Visit: Payer: Self-pay

## 2014-11-26 DIAGNOSIS — Z98891 History of uterine scar from previous surgery: Secondary | ICD-10-CM

## 2014-11-26 LAB — TYPE AND SCREEN
ABO/RH(D): A POS
Antibody Screen: NEGATIVE
UNIT DIVISION: 0
Unit division: 0

## 2014-11-26 LAB — CBC
HEMATOCRIT: 22.8 % — AB (ref 36.0–46.0)
HEMATOCRIT: 20.3 % — AB (ref 36.0–46.0)
Hemoglobin: 7.8 g/dL — ABNORMAL LOW (ref 12.0–15.0)
Hemoglobin: 6.8 g/dL — CL (ref 12.0–15.0)
MCH: 32.1 pg (ref 26.0–34.0)
MCH: 31.5 pg (ref 26.0–34.0)
MCHC: 34.2 g/dL (ref 30.0–36.0)
MCHC: 33.5 g/dL (ref 30.0–36.0)
MCV: 93.8 fL (ref 78.0–100.0)
MCV: 94 fL (ref 78.0–100.0)
PLATELETS: 118 10*3/uL — AB (ref 150–400)
Platelets: 112 10*3/uL — ABNORMAL LOW (ref 150–400)
RBC: 2.43 MIL/uL — ABNORMAL LOW (ref 3.87–5.11)
RBC: 2.16 MIL/uL — ABNORMAL LOW (ref 3.87–5.11)
RDW: 15.8 % — AB (ref 11.5–15.5)
RDW: 15.9 % — ABNORMAL HIGH (ref 11.5–15.5)
WBC: 20.2 10*3/uL — ABNORMAL HIGH (ref 4.0–10.5)
WBC: 15.6 10*3/uL — ABNORMAL HIGH (ref 4.0–10.5)

## 2014-11-26 LAB — HEMOGLOBIN AND HEMATOCRIT, BLOOD
HCT: 22.6 % — ABNORMAL LOW (ref 36.0–46.0)
Hemoglobin: 7.5 g/dL — ABNORMAL LOW (ref 12.0–15.0)

## 2014-11-26 MED ORDER — OXYCODONE-ACETAMINOPHEN 5-325 MG PO TABS: ORAL_TABLET | ORAL | Status: DC

## 2014-11-26 MED ORDER — IBUPROFEN 600 MG PO TABS: 600.0000 mg | ORAL_TABLET | Freq: Four times a day (QID) | ORAL | Status: DC

## 2014-11-26 MED ORDER — FERROUS SULFATE 325 (65 FE) MG PO TABS
325.0000 mg | ORAL_TABLET | Freq: Two times a day (BID) | ORAL | Status: DC
Start: 2014-11-26 — End: 2018-11-11

## 2014-11-26 MED ORDER — FERROUS SULFATE 325 (65 FE) MG PO TABS: 325.0000 mg | ORAL_TABLET | Freq: Two times a day (BID) | ORAL | Status: DC

## 2014-11-26 NOTE — Lactation Note (Signed)
This note was copied from the chart of Penny Abbott. Lactation Consultation Note  Patient Name: Penny Dietrich PatesCourtnee Enzor WUJWJ'XToday's Date: 11/26/2014 Reason for consult: Follow-up assessment  Baby reweighed this afternoon.  Naked weight: 3605g (7# 15.2oz).  Baby has gained 2 oz over the last 16 hours.  Lurline HareRichey, Iktan Aikman [facility] 11/26/2014, 4:20 PM

## 2014-11-26 NOTE — Progress Notes (Signed)
Pt denies dizziness or faintness when up

## 2014-11-26 NOTE — Lactation Note (Signed)
This note was copied from the chart of Penny Abbott. Lactation Consultation Note  Patient Name: Penny Dietrich PatesCourtnee Parmelee ZOXWR'UToday's Date: 11/26/2014 Reason for consult: Follow-up assessment  Mom was able to latch baby herself for this last feeding.  It was an excellent feeding w/frequent swallowing heard. Baby cup-fed 2 mLs of EBM after feeding, but was satiated from breast feeding.  Lurline HareRichey, Cope Marte [facility] 11/26/2014, 1:39 PM

## 2014-11-26 NOTE — Lactation Note (Signed)
This note was copied from the chart of Penny Abbott. Lactation Consultation Note  Patient Name: Penny Abbott ZOXWR'UToday's Date: 11/26/2014 Reason for consult: Follow-up assessment  Mom assisted in latching baby to L breast.  Baby latched w/relative ease and nursed for 8 minutes w/constant swallows.  Swallows easily heard to the naked ear. Mom's nipple was not distorted in shape when baby released latch. EBM was offered via Foley cup, but baby only tooK 5 mls before showing that he was satiated.     Dr. Albina BilletEmily Thompson given an update via phone.   Specifics of an asymmetric latch shown via [employer] website animation.  Mom to call when guests leave so I can show her how to use DEBP.   Lurline HareRichey, [name] [facility] 11/26/2014, 11:05 AM

## 2014-11-26 NOTE — Plan of Care (Signed)
Problem: Phase I Progression Outcomes Goal: VS, stable, temp < 100.4 degrees F Outcome: Completed/Met Date Met:  11/26/14

## 2014-11-26 NOTE — Lactation Note (Signed)
This note was copied from the chart of Penny Abbott. Lactation Consultation Note  Patient Name: Penny Dietrich PatesCourtnee Abbott ZOXWR'UToday's Date: 11/26/2014 Reason for consult: Follow-up assessment  Mom says baby won't latch to L breast.  Lower quadrants of L breast found to be full.  Twelve mL of transitional-appearing milk hand-expressed w/ease. Mom has my # to call for assist w/next feeding. Pacifier use not recommended at this time.   Lurline HareRichey, [name] [facility] 11/26/2014, 10:04 AM

## 2014-11-26 NOTE — Progress Notes (Signed)
CRITICAL VALUE ALERT  Critical value received:HgB 6.8  Date of notification:11/26/14  Time of notification:0620  Critical value read back:Yes.    Nurse who received alert:P Manson PasseyBrown RN  MD notified (1st page):will see at rounds  Time of first page: n/a  MD notified (2nd page):  Time of second page:  Responding MD:  Will see rounds  Time MD responded:n/a

## 2014-11-26 NOTE — Progress Notes (Addendum)
Subjective: Postop Day 3: Cesarean Delivery No complaints.  Pain controlled.  Lochia normal.  Breast feeding yes.  Objective: Temp:  [97.7 F (36.5 C)-99.6 F (37.6 C)] 97.7 F (36.5 C) (11/27 0543) Pulse Rate:  [66-98] 66 (11/27 0543) Resp:  [18-20] 18 (11/27 0543) BP: (97-130)/(46-60) 130/54 mmHg (11/27 0543) SpO2:  [100 %] 100 % (11/26 1145)  Physical Exam: Gen: NAD, A&O x 3 Lochia: Pad not heavily soiled x 7 hours Uterine Fundus: firm, appropriately tender. Slightly above umbilicus Incision: clean, dry and intact, healing well DVT Evaluation: Edema present, no calf tenderness bilaterally   Recent Labs  11/25/14 2315 11/26/14 0540  HGB 7.8* 6.8*  HCT 22.8* 20.3*  6 hours post   Assessment/Plan: Status post Primary  C-section PP D&C due to postpartum hemorrhage. Post op fever on Unasyn/Clindamycin.  Afebrile 36 hours. Severe anemia-clinically asymptomatic. S/p 2 units PRBCs-Hg decreased post transfusion but does not have atony, lochia normal. -Clinically doing well.  Continue IV antibiotics. Repeat Hg in 6 hours to assess stability. If remains afebrile, hemoglobin stable, plan for discharge later this afternoon. Discharge home on iron supplementation.      Penny Abbott 11/26/2014, 9:20 AM

## 2014-11-26 NOTE — Anesthesia Postprocedure Evaluation (Signed)
  Anesthesia Post-op Note  Patient: Penny Abbott  Procedure(s) Performed: * No procedures listed *  Patient Location: PACU and Mother/Baby  Anesthesia Type:Epidural  Level of Consciousness: awake, alert , oriented and patient cooperative  Airway and Oxygen Therapy: Patient Spontanous Breathing  Post-op Pain: none  Post-op Assessment: Post-op Vital signs reviewed, Patient's Cardiovascular Status Stable, Respiratory Function Stable, Patent Airway, No signs of Nausea or vomiting, Adequate PO intake, Pain level controlled, No headache, No backache, No residual numbness and No residual motor weakness  Post-op Vital Signs: Reviewed and stable  Last Vitals:  Filed Vitals:   11/26/14 0543  BP: 130/54  Pulse: 66  Temp: 36.5 C  Resp: 18    Complications: No apparent anesthesia complications

## 2014-11-26 NOTE — Progress Notes (Signed)
Hg 7.8 s/p transfusion.  Six hours later Hg 6.8.  Repeat Hg done prior to discharge to confirm stability=7.5. Pt remained afebrile. Pt discharged but will room in b/c baby has not had a BM.

## 2014-11-26 NOTE — Lactation Note (Signed)
This note was copied from the chart of Penny Abbott. Lactation Consultation Note  Patient Name: Penny Abbott Reason for consult: Follow-up assessment;Other (Comment) (need for supplement after breastfeeding) per MD recommendation.  Mom was trying to feed baby ebm by foley cup but baby was spilling the milk, so RN, Gaylyn RongKris requested a curved-tip syringe.  LC provided syringe and taught mom finger-feeding technique with approximately 5 ml's of ebm.  LC reviewed milk storage guidelines and reviewed use and cleaning of syringe for finger-feeding.   Maternal Data    Feeding Feeding Type: Breast Milk  LATCH Score/Interventions                      Lactation Tools Discussed/Used Tools: Other (comment) (curved-tip syringe) Pump Review: Milk Storage   Consult Status Consult Status: Follow-up Date: 11/27/14 Follow-up type: In-patient    Penny Abbott, Penny Abbott [facility] Abbott, 9:16 PM

## 2014-11-26 NOTE — Discharge Summary (Signed)
Obstetric Discharge Summary Reason for Admission: onset of labor Prenatal Procedures: none Intrapartum Procedures: cesarean: low cervical, transverse and Due to Failure to Progress. Postpartum Procedures: transfusion 2 units PRBCs, curettage and antibiotics Complications-Operative and Postpartum: hemorrhage and endometritis HEMOGLOBIN  Date Value Ref Range Status  11/26/2014 7.5* 12.0 - 15.0 g/dL Final   HCT  Date Value Ref Range Status  11/26/2014 22.6* 36.0 - 46.0 % Final    Physical Exam:  See progress note.  Discharge Diagnoses: Term Pregnancy-delivered and Severe anemia, postpartum endometritis s/p Unasyn, Clindamycin ~ 36 hours  Discharge Information: Date: 11/26/2014 Activity: pelvic rest and No heavy lifting. Diet: routine Medications: PNV, Ibuprofen, Iron and Percocet Condition: improved Instructions: See discharge instructions. Discharge to: home (rooming in) Follow-up Information    Follow up with Jessee AversOLE,TARA J., MD. Schedule an appointment as soon as possible for a visit in 2 weeks.   Specialty:  Obstetrics and Gynecology   Why:  Post op incision check   Contact information:   [address] [place] [address] [phone]       Newborn Data: Live born female  Birth Weight: 8 lb 8.9 oz (3881 g) APGAR: 9, 9  Baby not discharged due to failure to have a BM.    [name], Tristain Daily 11/26/2014, 6:14 PM

## 2014-11-26 NOTE — Discharge Instructions (Signed)

## 2014-11-27 ENCOUNTER — Ambulatory Visit: Payer: Self-pay

## 2014-11-27 NOTE — Lactation Note (Signed)
This note was copied from the chart of Penny Abbott. Lactation Consultation Note     Follow up consult with this mom and baby, full term and now 95 hours old. Mom has a large milk supply already, and baby ws latched when i walked in the room. i assisted mom with proper positioning of the baby and her hands. She was having trouble latching the baby to her left breast. I showed her cross cradle on the left, since she was doing football on the right. Mom said   This felst comfortable/ I instructed mom in the use of the manual hand pump, and qdvised her to call for a a DEP when she needs  One for going to work or school. Mom knows to call for questions/concerns.  Patient Name: Penny Dietrich PatesCourtnee Ditmer ZOXWR'UToday's Date: 11/27/2014 Reason for consult: Follow-up assessment   Maternal Data    Feeding Feeding Type: Breast Fed Length of feed: 10 min  LATCH Score/Interventions Latch: Grasps breast easily, tongue down, lips flanged, rhythmical sucking. Intervention(s): Teach feeding cues;Waking techniques Intervention(s): Adjust position;Assist with latch  Audible Swallowing: Spontaneous and intermittent Intervention(s): Skin to skin Intervention(s): Skin to skin;Hand expression  Type of Nipple: Everted at rest and after stimulation  Comfort (Breast/Nipple): Soft / non-tender     Hold (Positioning): Assistance needed to correctly position infant at breast and maintain latch. Intervention(s): Breastfeeding basics reviewed;Support Pillows;Position options;Skin to skin  LATCH Score: 9  Lactation Tools Discussed/Used Pump Review: Setup, frequency, and cleaning;Milk Storage   Consult Status Consult Status: Complete Follow-up type: Call as needed    Alfred LevinsLee, Emberlynn Riggan Anne 11/27/2014, 10:30 AM

## 2016-07-10 ENCOUNTER — Other Ambulatory Visit: Payer: Self-pay | Admitting: Obstetrics and Gynecology

## 2016-07-10 ENCOUNTER — Other Ambulatory Visit (HOSPITAL_COMMUNITY)
Admission: RE | Admit: 2016-07-10 | Discharge: 2016-07-10 | Disposition: A | Discharge status: Home / Self Care | Payer: 59 | Source: Ambulatory Visit | Attending: Obstetrics and Gynecology | Admitting: Obstetrics and Gynecology

## 2016-07-10 DIAGNOSIS — Z113 Encounter for screening for infections with a predominantly sexual mode of transmission: Secondary | ICD-10-CM | POA: Diagnosis present

## 2016-07-10 DIAGNOSIS — Z01419 Encounter for gynecological examination (general) (routine) without abnormal findings: Secondary | ICD-10-CM | POA: Insufficient documentation

## 2016-07-11 LAB — CYTOLOGY - PAP

## 2017-07-11 ENCOUNTER — Other Ambulatory Visit (HOSPITAL_COMMUNITY)
Admission: RE | Admit: 2017-07-11 | Discharge: 2017-07-11 | Disposition: A | Discharge status: Home / Self Care | Payer: 59 | Source: Ambulatory Visit | Attending: Obstetrics and Gynecology | Admitting: Obstetrics and Gynecology

## 2017-07-11 ENCOUNTER — Other Ambulatory Visit: Payer: Self-pay | Admitting: Obstetrics and Gynecology

## 2017-07-11 DIAGNOSIS — Z01419 Encounter for gynecological examination (general) (routine) without abnormal findings: Secondary | ICD-10-CM | POA: Diagnosis not present

## 2017-07-12 LAB — CYTOLOGY - PAP: DIAGNOSIS: NEGATIVE

## 2018-11-10 ENCOUNTER — Encounter: Payer: Self-pay | Admitting: Nurse Practitioner

## 2018-11-10 ENCOUNTER — Ambulatory Visit (INDEPENDENT_AMBULATORY_CARE_PROVIDER_SITE_OTHER): Payer: 59 | Admitting: Nurse Practitioner

## 2018-11-10 VITALS — BP 96/66 | HR 77 | Temp 98.3°F | Ht 66.0 in | Wt 132.2 lb

## 2018-11-10 DIAGNOSIS — R5383 Other fatigue: Secondary | ICD-10-CM

## 2018-11-10 DIAGNOSIS — D508 Other iron deficiency anemias: Secondary | ICD-10-CM | POA: Diagnosis not present

## 2018-11-10 DIAGNOSIS — Z789 Other specified health status: Secondary | ICD-10-CM

## 2018-11-10 DIAGNOSIS — E638 Other specified nutritional deficiencies: Secondary | ICD-10-CM | POA: Diagnosis not present

## 2018-11-10 LAB — CBC
HCT: 35.8 % — ABNORMAL LOW (ref 36.0–46.0)
Hemoglobin: 11.1 g/dL — ABNORMAL LOW (ref 12.0–15.0)
MCHC: 31 g/dL (ref 30.0–36.0)
MCV: 73.9 fl — ABNORMAL LOW (ref 78.0–100.0)
Platelets: 256 10*3/uL (ref 150.0–400.0)
RBC: 4.84 Mil/uL (ref 3.87–5.11)
RDW: 26.8 % — AB (ref 11.5–15.5)
WBC: 6.6 10*3/uL (ref 4.0–10.5)

## 2018-11-10 LAB — B12 AND FOLATE PANEL
Folate: 17.3 ng/mL (ref >5.9)
Vitamin B-12: 401 pg/mL (ref 211–911)

## 2018-11-10 LAB — IBC PANEL
Iron: 12 ug/dL — ABNORMAL LOW (ref 42–145)
Saturation Ratios: 2.2 % — ABNORMAL LOW (ref 20.0–50.0)
TRANSFERRIN: 391 mg/dL — AB (ref 212.0–360.0)

## 2018-11-10 LAB — TSH: TSH: 1.09 u[IU]/mL (ref 0.35–4.50)

## 2018-11-10 NOTE — Progress Notes (Signed)
Subjective:  Patient ID: Penny Abbott, female    DOB: 01/04/1991  Age: 27 y.o. MRN: [id number]  CC: Establish Care (est care/low iron consult. went to ED and [facility] 10/10/2018--see care everwhere. FYi--declined tdap today)  No pcp in past.  Anemia  Presents for initial visit. The condition has lasted for 1 month. Symptoms include light-headedness and malaise/fatigue. There has been no abdominal pain, anorexia, bruising/bleeding easily, confusion, fever, leg swelling, pallor, palpitations, paresthesias, pica or weight loss. Signs of blood loss that are not present include hematemesis, hematochezia, melena, menorrhagia and vaginal bleeding. Past treatments include oral iron supplements. There is no history of alcohol abuse, cancer, HIV/AIDS, inflammatory bowel disease, malnutrition or recent trauma. (Maintains vegetarian diet x 50yrs) There is no past history of bone marrow exam, colonoscopy, EGD or FOBT.  denies any menorrhagia Evaluated at [facility] ED 57month ago, diagnosed with iron deficient anemia, negative urine pregnancy.   GYN: Gerald Leitz with [facility], last seen 06/2017, normal PAP, no hx of abnormal, use of condom.  Reviewed past Medical, Social and Family history today.  Outpatient Medications Prior to Visit  Medication Sig Dispense Refill  . ibuprofen (ADVIL,MOTRIN) 600 MG tablet Take 1 tablet (600 mg total) by mouth every 6 (six) hours. 30 tablet 0  . ferrous sulfate 325 (65 FE) MG tablet Take 1 tablet (325 mg total) by mouth 2 (two) times daily with a meal. 40 tablet 0  . oxyCODONE-acetaminophen (PERCOCET/ROXICET) 5-325 MG per tablet 1-2 tablet every 4-6 hours prn pain. (Patient not taking: Reported on 11/10/2018) 30 tablet 0  . Prenatal Vit-Fe Fumarate-FA (PRENATAL MULTIVITAMIN) TABS tablet Take 1 tablet by mouth daily at 12 noon.     No facility-administered medications prior to visit.     ROS See HPI  Objective:  BP 96/66   Pulse 77   Temp 98.3 F (36.8 C)  (Oral)   Ht 5\' 6"  (1.676 m)   Wt 132 lb 3.2 oz (60 kg)   LMP 10/21/2018 (Exact Date)   SpO2 95%   BMI 21.34 kg/m   BP Readings from Last 3 Encounters:  11/10/18 96/66  11/26/14 (!) 130/54  09/25/14 105/57    Wt Readings from Last 3 Encounters:  11/10/18 132 lb 3.2 oz (60 kg)  11/22/14 202 lb (91.6 kg)  09/24/14 183 lb (83 kg)    Physical Exam  Constitutional: She is oriented to person, place, and time. She appears well-developed.  Eyes: No scleral icterus.  Neck: Neck supple. No thyromegaly present.  Cardiovascular: Normal rate, regular rhythm, normal heart sounds and intact distal pulses.  Pulmonary/Chest: Effort normal and breath sounds normal.  Abdominal: Soft. Bowel sounds are normal.  Lymphadenopathy:    She has no cervical adenopathy.  Neurological: She is alert and oriented to person, place, and time.  Skin: Skin is warm and dry. No rash noted.  Psychiatric: She has a normal mood and affect. Her behavior is normal. Thought content normal.  Vitals reviewed.   Lab Results  Component Value Date   WBC 6.6 11/10/2018   HGB 11.1 (L) 11/10/2018   HCT 35.8 (L) 11/10/2018   PLT 256.0 11/10/2018   GLUCOSE 91 11/23/2014   ALT 13 11/23/2014   AST 17 11/23/2014   NA 136 (L) 11/23/2014   K 4.0 11/23/2014   CL 101 11/23/2014   CREATININE 0.53 11/23/2014   BUN 8 11/23/2014   CO2 22 11/23/2014   TSH 1.09 11/10/2018   INR 1.24 11/23/2014    Assessment &  Plan:   Deira was seen today for establish care.  Diagnoses and all orders for this visit:  Iron deficiency anemia secondary to inadequate dietary iron intake -     CBC -     IBC panel -     TSH -     ferrous sulfate 325 (65 FE) MG tablet; Take 1 tablet (325 mg total) by mouth 2 (two) times daily with a meal.  Vegetarian diet  Nutrition deficiency due to a particular kind of food  Fatigue, unspecified type -     TSH -     B12 and Folate Panel -     Vitamin D 1,25 dihydroxy   I am having Neala  Safranek maintain her prenatal multivitamin, ibuprofen, oxyCODONE-acetaminophen, and ferrous sulfate.  Meds ordered this encounter  Medications  . ferrous sulfate 325 (65 FE) MG tablet    Sig: Take 1 tablet (325 mg total) by mouth 2 (two) times daily with a meal.    Dispense:  60 tablet    Refill:  2    Order Specific Question:   Supervising Provider    Answer:   MATTHEWS, CODY [[id number]]    Follow-up: Return in about 3 months (around 02/10/2019) for anemia.  Alysia Penna, NP

## 2018-11-10 NOTE — Patient Instructions (Addendum)
Go to lab for blood draw.  Maintain adequate oral hydration with water mostly. Use colace 100mg  OTC once a day to prevent constipation with current use of ferrous sulfate.   Vegetarian Eating and Nutrition Vegetarian diets are designed for those people who prefer vegetarian eating for religious, ecologic, or personal reasons. These diets, which are often lower in cholesterol and saturated fats, can provide significant health benefits. They result in lower rates of obesity, diabetes, breast and colon cancers, and cardiovascular and gallbladder diseases. There are several types of vegetarian diets, but all restrict animal proteins to some degree. Because animal proteins have important nutrients, vegetarians should make sure to get these nutrients from other types of foods. The main purpose of healthy vegetarian eating is to provide the energy, vitamins, and minerals for proper growth and health maintenance at any age. What do I need to know about vegetarian eating and nutrition? The following nutrients are found in animal products. It is important to make sure you get enough of these nutrients from your diet. If you think you are not getting the right nutrients or if you do not eat any animal products, talk with your health care provider or dietitian about taking supplements. Protein Your dietitian can help you determine your individual protein needs. Sources of protein include:  Beans, such as black beans or kidney beans, or other legumes, such as lentils and split peas. Soy products. Nuts, such as almonds, [place] nuts, and pecans. Seeds, such as sunflower seeds. Tofu, tempeh, and hummus. Eggs, milk, and cheese.  Vitamin B12 This vitamin is only found in:  Cheeses, fish, and eggs. It can also be found in some breakfast cereals and other prepared products that have added vitamin B12. If you eat no animal products, you should discuss taking a supplement with your health care provider or  dietitian.  Vitamin D Good sources of vitamin D include:  Cod liver oil. Fish, such as swordfish, salmon, and tuna. Dairy products. Orange juice. Fortified mushrooms. Cereals with added vitamin D.  Iron Good sources of iron include:  Dark, leafy greens. Nuts. Beans. Grain products that have added iron, such as cereals. Tofu, tempeh, soybeans, and quinoa. Plant-based iron is better absorbed when eaten with vitamin C. For example, squeeze lemon juice over cooked greens like kale, chard, or spinach, or have a glass of orange juice with your meals.  Omega-3 Fatty Acids Good sources of omega-3 fatty acids include:  Walnuts. Foods with added omega-3 fatty acids, such as eggs, milk, and juices. Flax seeds, canola oil, soybean oil, and tofu. Fish (cold water fatty fish such as salmon, sardines, mackerel, herring, lake trout, and albacore tuna).  Calcium Good sources of calcium include:  Dark, leafy greens, such as kale, bok choy, Chinese cabbage, collard greens and mustard greens. Broccoli. Okra. Dairy products. Soy products with added calcium. Calcium-fortified breakfast cereals, calcium-fortified fruit juices, and figs.  Zinc Good sources of zinc include:  Legumes. Dairy products. Wheat germ, cereals, and breads that have added zinc. Baked beans. Legumes, such as cashews, chickpeas, kidney beans, and green peas. Almonds and nut butters. Tofu and other soy products.  This information is not intended to replace advice given to you by your health care provider. Make sure you discuss any questions you have with your health care provider. Document Released: 12/20/2003 Document Revised: 05/24/2016 Document Reviewed: 10/22/2013 Elsevier Interactive Patient Education  2017 [employer].

## 2018-11-11 ENCOUNTER — Encounter: Payer: Self-pay | Admitting: Nurse Practitioner

## 2018-11-11 MED ORDER — FERROUS SULFATE 325 (65 FE) MG PO TABS
325.0000 mg | ORAL_TABLET | Freq: Two times a day (BID) | ORAL | 2 refills | Status: DC
Start: 2018-11-11 — End: 2018-11-12

## 2018-11-12 ENCOUNTER — Other Ambulatory Visit: Payer: Self-pay | Admitting: Emergency Medicine

## 2018-11-12 DIAGNOSIS — D508 Other iron deficiency anemias: Secondary | ICD-10-CM

## 2018-11-12 MED ORDER — FERROUS SULFATE 325 (65 FE) MG PO TABS: 325.0000 mg | ORAL_TABLET | Freq: Two times a day (BID) | ORAL | 2 refills | Status: DC

## 2018-11-12 NOTE — Progress Notes (Signed)
Patient states that pharmacy did not receive prescription for ferrous sulfate. Rx has been resent to patient pharmacy.

## 2018-11-13 LAB — VITAMIN D 1,25 DIHYDROXY
Vitamin D 1, 25 (OH)2 Total: 33 pg/mL (ref 18–72)
Vitamin D2 1, 25 (OH)2: <8 pg/mL
Vitamin D3 1, 25 (OH)2: 33 pg/mL

## 2019-02-03 ENCOUNTER — Other Ambulatory Visit (INDEPENDENT_AMBULATORY_CARE_PROVIDER_SITE_OTHER): Payer: BLUE CROSS/BLUE SHIELD

## 2019-02-03 ENCOUNTER — Ambulatory Visit: Payer: BLUE CROSS/BLUE SHIELD | Admitting: Family Medicine

## 2019-02-03 ENCOUNTER — Other Ambulatory Visit: Payer: Self-pay

## 2019-02-03 ENCOUNTER — Encounter: Payer: Self-pay | Admitting: Family Medicine

## 2019-02-03 VITALS — BP 110/70 | HR 51 | Temp 98.5°F | Ht 66.0 in | Wt 133.4 lb

## 2019-02-03 DIAGNOSIS — R000 Tachycardia, unspecified: Secondary | ICD-10-CM | POA: Diagnosis not present

## 2019-02-03 DIAGNOSIS — R0602 Shortness of breath: Secondary | ICD-10-CM | POA: Insufficient documentation

## 2019-02-03 DIAGNOSIS — D508 Other iron deficiency anemias: Secondary | ICD-10-CM

## 2019-02-03 DIAGNOSIS — Z789 Other specified health status: Secondary | ICD-10-CM

## 2019-02-03 DIAGNOSIS — L659 Nonscarring hair loss, unspecified: Secondary | ICD-10-CM | POA: Diagnosis not present

## 2019-02-03 LAB — CBC
HCT: 41.1 % (ref 36.0–46.0)
Hemoglobin: 13.4 g/dL (ref 12.0–15.0)
MCHC: 32.7 g/dL (ref 30.0–36.0)
MCV: 86 fl (ref 78.0–100.0)
Platelets: 181 10*3/uL (ref 150.0–400.0)
RBC: 4.78 Mil/uL (ref 3.87–5.11)
RDW: 16.3 % — ABNORMAL HIGH (ref 11.5–15.5)
WBC: 8 10*3/uL (ref 4.0–10.5)

## 2019-02-03 NOTE — Patient Instructions (Signed)
Iron Deficiency Anemia, Adult Iron-deficiency anemia is when you have a low amount of red blood cells or hemoglobin. This happens because you have too little iron in your body. Hemoglobin carries oxygen to parts of the body. Anemia can cause your body to not get enough oxygen. It may or may not cause symptoms. Follow these instructions at home: Medicines  Take over-the-counter and prescription medicines only as told by your doctor. This includes iron pills (supplements) and vitamins.  If you cannot handle taking iron pills by mouth, ask your doctor about getting iron through: ? A vein (intravenously). ? A shot (injection) into a muscle.  Take iron pills when your stomach is empty. If you cannot handle this, take them with food.  Do not drink milk or take antacids at the same time as your iron pills.  To prevent trouble pooping (constipation), eat fiber or take medicine (stool softener) as told by your doctor. Eating and drinking   Talk with your doctor before changing the foods you eat. He or she may tell you to eat foods that have a lot of iron, such as: ? Liver. ? Lowfat (lean) beef. ? Breads and cereals that have iron added to them (fortified breads and cereals). ? Eggs. ? Dried fruit. ? Dark green, leafy vegetables.  Drink enough fluid to keep your pee (urine) clear or pale yellow.  Eat fresh fruits and vegetables that are high in vitamin C. They help your body to use iron. Foods with a lot of vitamin C include: ? Oranges. ? Peppers. ? Tomatoes. ? Mangoes. General instructions  Return to your normal activities as told by your doctor. Ask your doctor what activities are safe for you.  Keep yourself clean, and keep things clean around you (your surroundings). Anemia can make you get sick more easily.  Keep all follow-up visits as told by your doctor. This is important. Contact a doctor if:  You feel sick to your stomach (nauseous).  You throw up (vomit).  You feel  weak.  You are sweating for no clear reason.  You have trouble pooping, such as: ? Pooping (having a bowel movement) less than 3 times a week. ? Straining to poop. ? Having poop that is hard, dry, or larger than normal. ? Feeling full or bloated. ? Pain in the lower belly. ? Not feeling better after pooping. Get help right away if:  You pass out (faint). If this happens, do not drive yourself to the hospital. Call your local emergency services (911 in the [place]  You have chest pain.  You have shortness of breath that: ? Is very bad. ? Gets worse with physical activity.  You have a fast heartbeat.  You get light-headed when getting up from sitting or lying down. This information is not intended to replace advice given to you by your health care provider. Make sure you discuss any questions you have with your health care provider. Document Released: 01/19/2011 Document Revised: 09/05/2016 Document Reviewed: 09/05/2016 Elsevier Interactive Patient Education  2019 [employer].  Vegetarian Eating Information Many people may prefer vegetarian eating for religious, environmental, or personal reasons. These diets are often lower in calories, salt, sugar, cholesterol, and saturated and trans fats. Vegetarian eating provides significant health benefits. People who eat a vegetarian diet often have lower rates of:  Obesity.  Diabetes.  Breast and colon cancers.  Cardiovascular and gallbladder diseases. What are the types of vegetarian eating?  Vegetarian eating includes dietary choices that focus on eating  mostly vegetables and fruit, grains, beans, nuts, and seeds. There are several different types of vegetarian eating. Talk with a diet and nutrition specialist (dietitian) about what type of vegetarian diet is best for you. Lacto-ovo vegetarian  Recommended foods: fruits and vegetables, milk and dairy, eggs, grains, soy and vegetable protein, beans, nuts, and seeds.  Foods to  avoid: meat, poultry, seafood, animal-based broths and gravies, and gelatin. Lacto-vegetarian  Recommended foods: fruits and vegetables, milk and dairy, grains, soy and vegetable protein, beans, nuts, and seeds.  Foods to avoid: meat, poultry, seafood, animal-based broths and gravies, gelatin, and eggs. Vegan  Recommended foods: fruits and vegetables, grains, soy and vegetable protein, beans, nuts, and seeds.  Foods to avoid: meat, poultry, seafood, animal-based broths and gravies, gelatin, eggs, milk and dairy, and honey. What do I need to know about vegetarian eating? All vegetarian diets restrict proteins that come from animals. Foods that come from animals have important nutrients, such as protein, fats, vitamins, and minerals. It is important to get these nutrients from other types of foods. If you think you may not be getting the right nutrients, or if you do not eat any animal products, talk with your health care provider or dietitian about taking supplements. A dietitian can help determine your individual nutrient needs. What are tips for following this plan? Eat a diet that includes a variety of fruits, vegetables, whole grains, and protein sources. This is important to make sure you get enough of the following nutrients: Protein Healthy protein sources include:  Eggs, milk, and cheese. Soy products. Tofu, tempeh, and textured vegetable protein (TVP). Quinoa. Hemp seeds. Other protein sources include:  Beans, such as black beans or kidney beans. Other legumes, such as lentils and split peas. Nuts, such as almonds, [place] nuts, and pecans. Seeds, such as sunflower seeds. To get the most benefit from plant-based proteins, combine two or more sources of plant protein with whole grains in one dish. Examples include beans and rice, almond butter on bread, or sunflower seeds on noodles. Vitamin B12 Sources of vitamin B12 include:  Cheese and eggs. Breakfast cereals and other prepared  products that have vitamin B12 added (fortified products). If you eat a vegan diet, ask your health care provider or dietitian about taking a B12 supplement. Vitamin D Good sources of vitamin D include:  Egg yolks. Fortified dairy products. Fortified orange juice. Mushrooms. Cereals with added vitamin D. Another way of getting vitamin D is to spend 10 minutes each day in the sun. This helps your body make its own vitamin D. Depending on your age, you may need to take a vitamin D supplement. Talk with your health care provider or dietitian about how much vitamin D you need in a supplement. Iron Healthy sources of iron include:  Dark, leafy greens. Nuts. Beans. Grain products that are fortified with iron, such as cereals. Tofu, tempeh, soybeans, and quinoa. To get the most iron from plant-based foods:  Eat iron-containing plant-based foods with vitamin C. For example, squeeze fresh lemon juice over cooked greens like kale, chard, or spinach, or have a glass of orange juice with your meals.  Avoid eating dairy products, coffee, or tea with iron-containing foods. Omega-3 fatty acids Good sources of omega-3 fatty acids include:  Walnuts. Flax seeds, canola oil, soybean oil, and tofu. Avocados. Olives and olive oil. Foods with added omega-3 fatty acids, such as eggs, milk, and juices. Calcium Good sources of calcium include:  Dairy products. Fortified non-dairy milk. Fortified tofu.  Dark, leafy greens, such as kale, bok choy, Chinese cabbage, collard greens and mustard greens. Broccoli. Okra. Fortified breakfast cereals and fruit juices. Figs. Zinc Good sources of zinc include:  Pumpkin seeds. Legumes, such as chickpeas, kidney beans, and green peas. Wheat germ, whole grains, and fortified cereals. Mushrooms. Spinach and kale. Milk and dairy foods. Dark chocolate. Summary  Vegetarian eating is a choice made by people who prefer vegetarian eating for religious, environmental, or personal  reasons. These diets can provide significant health benefits.  There are several types of vegetarian diets, but all restrict proteins that come from animals.  It is important to make sure that you are getting enough nutrients, including protein, vitamin B12, vitamin D, iron, omega-3 fatty acids, calcium, and zinc from your diet.  If you think you are not getting the right nutrients or if you do not eat any animal products, talk with your health care provider or dietitian. This information is not intended to replace advice given to you by your health care provider. Make sure you discuss any questions you have with your health care provider. Document Released: 12/20/2003 Document Revised: 02/19/2017 Document Reviewed: 02/19/2017 Elsevier Interactive Patient Education  2019 [employer].  Shortness of Breath, Adult Shortness of breath is when a person has trouble breathing enough air or when a person feels like she or he is having trouble breathing in enough air. Shortness of breath could be a sign of a medical problem. Follow these instructions at home:   Pay attention to any changes in your symptoms.  Do not use any products that contain nicotine or tobacco, such as cigarettes, e-cigarettes, and chewing tobacco.  Do not smoke. Smoking is a common cause of shortness of breath. If you need help quitting, ask your health care provider.  Avoid things that can irritate your airways, such as: ? Mold. ? Dust. ? Air pollution. ? Chemical fumes. ? Things that can cause allergy symptoms (allergens), if you have allergies.  Keep your living space clean and free of mold and dust.  Rest as needed. Slowly return to your usual activities.  Take over-the-counter and prescription medicines only as told by your health care provider. This includes oxygen therapy and inhaled medicines.  Keep all follow-up visits as told by your health care provider. This is important. Contact a health care provider  if:  Your condition does not improve as soon as expected.  You have a hard time doing your normal activities, even after you rest.  You have new symptoms. Get help right away if:  Your shortness of breath gets worse.  You have shortness of breath when you are resting.  You feel light-headed or you faint.  You have a cough that is not controlled with medicines.  You cough up blood.  You have pain with breathing.  You have pain in your chest, arms, shoulders, or abdomen.  You have a fever.  You cannot walk up stairs or exercise the way that you normally do. These symptoms may represent a serious problem that is an emergency. Do not wait to see if the symptoms will go away. Get medical help right away. Call your local emergency services (911 in the [place] Do not drive yourself to the hospital. Summary  Shortness of breath is when a person has trouble breathing enough air. It can be a sign of a medical problem.  Avoid things that irritate your lungs, such as smoking, pollution, mold, and dust.  Pay attention to changes in your  symptoms and contact your health care provider if you have a hard time completing daily activities because of shortness of breath. This information is not intended to replace advice given to you by your health care provider. Make sure you discuss any questions you have with your health care provider. Document Released: 09/11/2001 Document Revised: 05/19/2018 Document Reviewed: 05/19/2018 Elsevier Interactive Patient Education  2019 [employer].

## 2019-02-03 NOTE — Progress Notes (Signed)
Established Patient Office Visit  Subjective:  Patient ID: Penny PatesCourtnee Cambria, female    DOB: 01/14/1991  Age: 28 y.o. MRN: [id number]  CC:  Chief Complaint  Patient presents with  . Tachycardia  . Alopecia    HPI Keiry Steinfeldt presents for evaluation of a week history of dyspnea on exertion with tachycardia and skipped beats.  Patient does not smoke drink alcohol or use illicit drugs.  She has not been sick recently and has no history of asthma.  Denies increased caffeine intake.  She has no history of heart or lung disease.  She consumes a vegetarian diet.  She does have a history of iron deficiency with low hemoglobin.  She claims compliance with her iron therapy.  Tells of hair loss but denies cold intolerance, constipation or fatigue.  She is not athletically active.  She is accompanied by her mother today.  Past Medical History:  Diagnosis Date  . Iron (Fe) deficiency anemia   . Medical history non-contributory     Past Surgical History:  Procedure Laterality Date  . ACHILLES TENDON REPAIR    . CESAREAN SECTION N/A 11/23/2014   Procedure: CESAREAN SECTION;  Surgeon: Dorien Chihuahuaara J. Richardson Doppole, MD;  Location: [facility];  Service: Obstetrics;  Laterality: N/A;  . DILATION AND EVACUATION N/A 11/23/2014   Procedure: Dilatation and Currettage with Ultrasound Guidance, Attempted Bakri Balloon Insertion ;  Surgeon: Dorien Chihuahuaara J. Richardson Doppole, MD;  Location: [facility];  Service: Gynecology;  Laterality: N/A;    Family History  Problem Relation Age of Onset  . Diabetes Father   . Diabetes Paternal Uncle     Social History   Socioeconomic History  . Marital status: Single    Spouse name: Not on file  . Number of children: 1  . Years of education: Not on file  . Highest education level: Not on file  Occupational History  . Not on file  Social Needs  . Financial resource strain: Not on file  . Food insecurity:    Worry: Not on file    Inability: Not on file  . Transportation needs:    Medical: Not on  file    Non-medical: Not on file  Tobacco Use  . Smoking status: Never Smoker  . Smokeless tobacco: Never Used  Substance and Sexual Activity  . Alcohol use: No  . Drug use: No  . Sexual activity: Yes    Birth control/protection: Condom  Lifestyle  . Physical activity:    Days per week: Not on file    Minutes per session: Not on file  . Stress: Not on file  Relationships  . Social connections:    Talks on phone: Not on file    Gets together: Not on file    Attends religious service: Not on file    Active member of club or organization: Not on file    Attends meetings of clubs or organizations: Not on file    Relationship status: Not on file  . Intimate partner violence:    Fear of current or ex partner: Not on file    Emotionally abused: Not on file    Physically abused: Not on file    Forced sexual activity: Not on file  Other Topics Concern  . Not on file  Social History Narrative  . Not on file    Outpatient Medications Prior to Visit  Medication Sig Dispense Refill  . ferrous sulfate 325 (65 FE) MG tablet Take 1 tablet (325 mg total) by mouth  2 (two) times daily with a meal. 60 tablet 2  . Prenatal Vit-Fe Fumarate-FA (PRENATAL MULTIVITAMIN) TABS tablet Take 1 tablet by mouth daily at 12 noon.    Marland Kitchen ibuprofen (ADVIL,MOTRIN) 600 MG tablet Take 1 tablet (600 mg total) by mouth every 6 (six) hours. 30 tablet 0  . oxyCODONE-acetaminophen (PERCOCET/ROXICET) 5-325 MG per tablet 1-2 tablet every 4-6 hours prn pain. (Patient not taking: Reported on 11/10/2018) 30 tablet 0   No facility-administered medications prior to visit.     No Known Allergies  ROS Review of Systems  Constitutional: Negative for diaphoresis, fatigue, fever and unexpected weight change.  HENT: Negative.   Eyes: Negative.   Respiratory: Positive for shortness of breath. Negative for chest tightness and wheezing.   Cardiovascular: Positive for palpitations. Negative for chest pain.  Gastrointestinal:  Negative.   Endocrine: Negative for polyphagia and polyuria.  Genitourinary: Negative.   Musculoskeletal: Negative for gait problem and joint swelling.  Skin: Negative for pallor and rash.  Allergic/Immunologic: Negative for immunocompromised state.  Neurological: Negative for weakness and headaches.  Hematological: Does not bruise/bleed easily.  Psychiatric/Behavioral: Negative for dysphoric mood. The patient is not nervous/anxious.    Depression screen [facility] 2/9 02/03/2019 11/10/2018  Decreased Interest 0 0  Down, Depressed, Hopeless 0 0  PHQ - 2 Score 0 0  Altered sleeping 0 -  Tired, decreased energy 1 -  Change in appetite 3 -  Feeling bad or failure about yourself  0 -  Trouble concentrating 3 -  Moving slowly or fidgety/restless 0 -  Suicidal thoughts 0 -  PHQ-9 Score 7 -  Difficult doing work/chores Not difficult at all -      Objective:    Physical Exam  Constitutional: She is oriented to person, place, and time. She appears well-developed and well-nourished. No distress.  HENT:  Head: Atraumatic.  Right Ear: External ear normal.  Left Ear: External ear normal.  Mouth/Throat: Oropharynx is clear and moist. No oropharyngeal exudate.  Eyes: Pupils are equal, round, and reactive to light. Conjunctivae are normal. Right eye exhibits no discharge. Left eye exhibits no discharge. No scleral icterus.  Neck: Neck supple. No JVD present. No tracheal deviation present. No thyromegaly present.  Cardiovascular: Normal rate, regular rhythm and normal heart sounds.  Pulmonary/Chest: Effort normal and breath sounds normal. No stridor.  Abdominal: Bowel sounds are normal.  Lymphadenopathy:    She has no cervical adenopathy.  Neurological: She is alert and oriented to person, place, and time.  Skin: Skin is warm and dry. She is not diaphoretic.  Psychiatric: She has a normal mood and affect.    BP 110/70   Pulse (!) 51   Temp 98.5 F (36.9 C) (Oral)   Ht 5\' 6"  (1.676 m)   Wt  133 lb 6 oz (60.5 kg)   SpO2 99%   BMI 21.53 kg/m  Wt Readings from Last 3 Encounters:  02/03/19 133 lb 6 oz (60.5 kg)  11/10/18 132 lb 3.2 oz (60 kg)  11/22/14 202 lb (91.6 kg)   BP Readings from Last 3 Encounters:  02/03/19 110/70  11/10/18 96/66  11/26/14 (!) 130/54   Guideline developer:  UpToDate (see UpToDate for funding source) Date Released: June 2014  Health Maintenance Due  Topic Date Due  . Janet Berlin  11/30/2009    There are no preventive care reminders to display for this patient.  Lab Results  Component Value Date   TSH 1.09 11/10/2018   Lab Results  Component Value  Date   WBC 6.6 11/10/2018   HGB 11.1 (L) 11/10/2018   HCT 35.8 (L) 11/10/2018   MCV 73.9 (L) 11/10/2018   PLT 256.0 11/10/2018   Lab Results  Component Value Date   NA 136 (L) 11/23/2014   K 4.0 11/23/2014   CO2 22 11/23/2014   GLUCOSE 91 11/23/2014   BUN 8 11/23/2014   CREATININE 0.53 11/23/2014   BILITOT 0.2 (L) 11/23/2014   ALKPHOS 130 (H) 11/23/2014   AST 17 11/23/2014   ALT 13 11/23/2014   PROT 6.3 11/23/2014   ALBUMIN 3.0 (L) 11/23/2014   CALCIUM 9.2 11/23/2014   ANIONGAP 13 11/23/2014   No results found for: CHOL No results found for: HDL No results found for: LDLCALC No results found for: TRIG No results found for: CHOLHDL No results found for: OZHY8M    Assessment & Plan:   Problem List Items Addressed This Visit      Other   Vegetarian diet   Relevant Orders   Vitamin B12   CBC   Iron, TIBC and Ferritin Panel   VITAMIN D 25 Hydroxy (Vit-D Deficiency, Fractures)   Iron deficiency anemia secondary to inadequate dietary iron intake   Relevant Orders   CBC   Iron, TIBC and Ferritin Panel   Shortness of breath - Primary   Relevant Orders   EKG 12-Lead (Completed)   Basic metabolic panel   ECHOCARDIOGRAM COMPLETE   Hair loss   Relevant Orders   TSH   Tachycardia   Relevant Orders   EKG 12-Lead (Completed)   TSH   ECHOCARDIOGRAM COMPLETE      No  orders of the defined types were placed in this encounter.   Follow-up: Return in about 1 month (around 03/04/2019).   Patient was given information on iron deficiency anemia, vegetarianism shortness of breath.

## 2019-02-03 NOTE — Addendum Note (Signed)
Addended by: Arva Chafe on: 02/03/2019 01:22 PM   Modules accepted: Orders

## 2019-02-11 ENCOUNTER — Ambulatory Visit: Payer: 59 | Admitting: Nurse Practitioner

## 2019-02-18 ENCOUNTER — Ambulatory Visit: Payer: BLUE CROSS/BLUE SHIELD | Admitting: Nurse Practitioner

## 2019-02-18 ENCOUNTER — Encounter: Payer: Self-pay | Admitting: Nurse Practitioner

## 2019-02-18 VITALS — BP 124/80 | HR 80 | Temp 98.7°F | Ht 66.0 in | Wt 136.0 lb

## 2019-02-18 DIAGNOSIS — F411 Generalized anxiety disorder: Secondary | ICD-10-CM | POA: Diagnosis not present

## 2019-02-18 MED ORDER — FLUOXETINE HCL 20 MG PO TABS: 20.0000 mg | ORAL_TABLET | Freq: Every day | ORAL | 2 refills | Status: DC

## 2019-02-18 NOTE — Progress Notes (Signed)
Subjective:  Patient ID: Penny Abbott, female    DOB: [birth year]  Age: 28 y.o. MRN: [id number]  CC: Follow-up (follow up/ anxiety is getting worse/ FYI saw Dr. Doreene Burke 02/03/2019--see lab result and depression screening 02/04/ denied tdap)   HPI  Ms. Connole present for evaluation of anxiety and depression. Reports she was Diagnosed with ADHD in 7th grade, medication used for 1year: adderrall and concerta used in past. Today she reports Excessive worrying and daily rituals (checking doors at home-present) No hallucination, No distractibility,No difficulty completing task at work. ETOH: social use, maybe once a month. no illicit drung use,  Depression screen [facility] 2/9 02/03/2019 11/10/2018  Decreased Interest 0 0  Down, Depressed, Hopeless 0 0  PHQ - 2 Score 0 0  Altered sleeping 0 -  Tired, decreased energy 1 -  Change in appetite 3 -  Feeling bad or failure about yourself  0 -  Trouble concentrating 3 -  Moving slowly or fidgety/restless 0 -  Suicidal thoughts 0 -  PHQ-9 Score 7 -  Difficult doing work/chores Not difficult at all -   GAD 7 : Generalized Anxiety Score 02/03/2019  Nervous, Anxious, on Edge 3  Control/stop worrying 3  Worry too much - different things 3  Trouble relaxing 3  Restless 3  Easily annoyed or irritable 3  Afraid - awful might happen 3  Total GAD 7 Score 21  Anxiety Difficulty Very difficult   Reviewed past Medical, Social and Family history today.  Outpatient Medications Prior to Visit  Medication Sig Dispense Refill  . ferrous sulfate 325 (65 FE) MG tablet Take 1 tablet (325 mg total) by mouth 2 (two) times daily with a meal. 60 tablet 2  . Prenatal Vit-Fe Fumarate-FA (PRENATAL MULTIVITAMIN) TABS tablet Take 1 tablet by mouth daily at 12 noon.     No facility-administered medications prior to visit.     ROS See HPI  Objective:  BP 124/80   Pulse 80   Temp 98.7 F (37.1 C) (Oral)   Ht 5\' 6"  (1.676 m)   Wt 136 lb (61.7 kg)   LMP  02/03/2019 (Exact Date)   SpO2 98%   BMI 21.95 kg/m   BP Readings from Last 3 Encounters:  02/18/19 124/80  02/03/19 110/70  11/10/18 96/66    Wt Readings from Last 3 Encounters:  02/18/19 136 lb (61.7 kg)  02/03/19 133 lb 6 oz (60.5 kg)  11/10/18 132 lb 3.2 oz (60 kg)    Physical Exam Vitals signs reviewed.  Neurological:     Mental Status: She is alert and oriented to person, place, and time.  Psychiatric:        Attention and Perception: Attention normal.        Mood and Affect: Mood and affect normal.        Speech: Speech normal.        Behavior: Behavior normal. Behavior is cooperative.        Thought Content: Thought content normal.        Cognition and Memory: Cognition and memory normal.        Judgment: Judgment normal.     Lab Results  Component Value Date   WBC 8.0 02/03/2019   HGB 13.4 02/03/2019   HCT 41.1 02/03/2019   PLT 181.0 02/03/2019   GLUCOSE 91 11/23/2014   ALT 13 11/23/2014   AST 17 11/23/2014   NA 136 (L) 11/23/2014   K 4.0 11/23/2014   CL 101 11/23/2014  CREATININE 0.53 11/23/2014   BUN 8 11/23/2014   CO2 22 11/23/2014   TSH 1.09 11/10/2018   INR 1.24 11/23/2014    Assessment & Plan:   Jobina was seen today for follow-up.  Diagnoses and all orders for this visit:  GAD (generalized anxiety disorder) -     FLUoxetine (PROZAC) 20 MG tablet; Take 1 tablet (20 mg total) by mouth daily. -     Ambulatory referral to Psychology -     Ambulatory referral to Psychiatry   I am having Oreoluwa Laker start on FLUoxetine. I am also having her maintain her prenatal multivitamin and ferrous sulfate.  Meds ordered this encounter  Medications  . FLUoxetine (PROZAC) 20 MG tablet    Sig: Take 1 tablet (20 mg total) by mouth daily.    Dispense:  30 tablet    Refill:  2    Order Specific Question:   Supervising Provider    Answer:   Dianne Dun [[id number]]    Problem List Items Addressed This Visit    None    Visit Diagnoses    GAD  (generalized anxiety disorder)    -  Primary   Relevant Medications   FLUoxetine (PROZAC) 20 MG tablet   Other Relevant Orders   Ambulatory referral to Psychology   Ambulatory referral to Psychiatry       Follow-up: Return in about 4 weeks (around 03/18/2019) for anxiety ( ).  Alysia Penna, NP

## 2019-02-18 NOTE — Patient Instructions (Signed)
I instructed pt to start 1/2 tablet once daily for 1 week and then increase to a full tablet once daily on week two as tolerated.   We discussed common side effects such as nausea, drowsiness and weight gain.  Also discussed rare but serious side effect of suicide ideation.  She is instructed to discontinue medication go directly to ED if this occurs.  Pt verbalizes understanding.   Plan follow up in 1 month to evaluate progress.    You will be contacted to schedule appt with psychiatry and psychology.  Living With Anxiety  After being diagnosed with an anxiety disorder, you may be relieved to know why you have felt or behaved a certain way. It is natural to also feel overwhelmed about the treatment ahead and what it will mean for your life. With care and support, you can manage this condition and recover from it. How to cope with anxiety Dealing with stress Stress is your body's reaction to life changes and events, both good and bad. Stress can last just a few hours or it can be ongoing. Stress can play a major role in anxiety, so it is important to learn both how to cope with stress and how to think about it differently. Talk with your health care provider or a counselor to learn more about stress reduction. He or she may suggest some stress reduction techniques, such as:  Music therapy. This can include creating or listening to music that you enjoy and that inspires you.  Mindfulness-based meditation. This involves being aware of your normal breaths, rather than trying to control your breathing. It can be done while sitting or walking.  Centering prayer. This is a kind of meditation that involves focusing on a word, phrase, or sacred image that is meaningful to you and that brings you peace.  Deep breathing. To do this, expand your stomach and inhale slowly through your nose. Hold your breath for 3-5 seconds. Then exhale slowly, allowing your stomach muscles to relax.  Self-talk. This is a  skill where you identify thought patterns that lead to anxiety reactions and correct those thoughts.  Muscle relaxation. This involves tensing muscles then relaxing them. Choose a stress reduction technique that fits your lifestyle and personality. Stress reduction techniques take time and practice. Set aside 5-15 minutes a day to do them. Therapists can offer training in these techniques. The training may be covered by some insurance plans. Other things you can do to manage stress include:  Keeping a stress diary. This can help you learn what triggers your stress and ways to control your response.  Thinking about how you respond to certain situations. You may not be able to control everything, but you can control your reaction.  Making time for activities that help you relax, and not feeling guilty about spending your time in this way. Therapy combined with coping and stress-reduction skills provides the best chance for successful treatment. Medicines Medicines can help ease symptoms. Medicines for anxiety include:  Anti-anxiety drugs.  Antidepressants.  Beta-blockers. Medicines may be used as the main treatment for anxiety disorder, along with therapy, or if other treatments are not working. Medicines should be prescribed by a health care provider. Relationships Relationships can play a big part in helping you recover. Try to spend more time connecting with trusted friends and family members. Consider going to couples counseling, taking family education classes, or going to family therapy. Therapy can help you and others better understand the condition. How to recognize  changes in your condition Everyone has a different response to treatment for anxiety. Recovery from anxiety happens when symptoms decrease and stop interfering with your daily activities at home or work. This may mean that you will start to:  Have better concentration and focus.  Sleep better.  Be less  irritable.  Have more energy.  Have improved memory. It is important to recognize when your condition is getting worse. Contact your health care provider if your symptoms interfere with home or work and you do not feel like your condition is improving. Where to find help and support: You can get help and support from these sources:  Self-help groups.  Online and [employer].  A trusted spiritual leader.  Couples counseling.  Family education classes.  Family therapy. Follow these instructions at home:  Eat a healthy diet that includes plenty of vegetables, fruits, whole grains, low-fat dairy products, and lean protein. Do not eat a lot of foods that are high in solid fats, added sugars, or salt.  Exercise. Most adults should do the following: ? Exercise for at least 150 minutes each week. The exercise should increase your heart rate and make you sweat (moderate-intensity exercise). ? Strengthening exercises at least twice a week.  Cut down on caffeine, tobacco, alcohol, and other potentially harmful substances.  Get the right amount and quality of sleep. Most adults need 7-9 hours of sleep each night.  Make choices that simplify your life.  Take over-the-counter and prescription medicines only as told by your health care provider.  Avoid caffeine, alcohol, and certain over-the-counter cold medicines. These may make you feel worse. Ask your pharmacist which medicines to avoid.  Keep all follow-up visits as told by your health care provider. This is important. Questions to ask your health care provider  Would I benefit from therapy?  How often should I follow up with a health care provider?  How long do I need to take medicine?  Are there any long-term side effects of my medicine?  Are there any alternatives to taking medicine? Contact a health care provider if:  You have a hard time staying focused or finishing daily tasks.  You spend many hours a day  feeling worried about everyday life.  You become exhausted by worry.  You start to have headaches, feel tense, or have nausea.  You urinate more than normal.  You have diarrhea. Get help right away if:  You have a racing heart and shortness of breath.  You have thoughts of hurting yourself or others. If you ever feel like you may hurt yourself or others, or have thoughts about taking your own life, get help right away. You can go to your nearest emergency department or call:  Your local emergency services (911 in the [place]  A suicide crisis helpline, such as the National Suicide Prevention Lifeline at 7264856712. This is open 24-hours a day. Summary  Taking steps to deal with stress can help calm you.  Medicines cannot cure anxiety disorders, but they can help ease symptoms.  Family, friends, and partners can play a big part in helping you recover from an anxiety disorder. This information is not intended to replace advice given to you by your health care provider. Make sure you discuss any questions you have with your health care provider. Document Released: 12/11/2016 Document Revised: 12/11/2016 Document Reviewed: 12/11/2016 Elsevier Interactive Patient Education  2019 [employer].

## 2019-02-19 ENCOUNTER — Encounter: Payer: Self-pay | Admitting: Nurse Practitioner

## 2019-03-17 ENCOUNTER — Other Ambulatory Visit: Payer: Self-pay

## 2019-03-18 ENCOUNTER — Ambulatory Visit: Payer: BLUE CROSS/BLUE SHIELD | Admitting: Nurse Practitioner

## 2019-03-18 ENCOUNTER — Ambulatory Visit: Payer: Self-pay | Admitting: Nurse Practitioner

## 2019-04-16 ENCOUNTER — Telehealth: Payer: Self-pay | Admitting: Nurse Practitioner

## 2019-04-16 NOTE — Telephone Encounter (Signed)
Called and talked with patient. She states that she does want to make a virtual appointment with Claris Gower at this time.

## 2019-09-29 ENCOUNTER — Encounter: Payer: Self-pay | Admitting: Nurse Practitioner

## 2019-09-29 ENCOUNTER — Other Ambulatory Visit: Payer: Self-pay

## 2019-09-29 ENCOUNTER — Ambulatory Visit: Payer: Managed Care, Other (non HMO) | Admitting: Nurse Practitioner

## 2019-09-29 ENCOUNTER — Telehealth (INDEPENDENT_AMBULATORY_CARE_PROVIDER_SITE_OTHER): Payer: Managed Care, Other (non HMO) | Admitting: Nurse Practitioner

## 2019-09-29 VITALS — Ht 66.0 in

## 2019-09-29 DIAGNOSIS — D508 Other iron deficiency anemias: Secondary | ICD-10-CM

## 2019-09-29 DIAGNOSIS — L659 Nonscarring hair loss, unspecified: Secondary | ICD-10-CM | POA: Diagnosis not present

## 2019-09-29 NOTE — Progress Notes (Signed)
Virtual Visit via Video Note  I connected with Penny Abbott on 09/29/19 at  3:30 PM EDT by a video enabled telemedicine application and verified that I am speaking with the correct person using two identifiers.  Location: Patient: Home Provider: Office   I discussed the limitations of evaluation and management by telemedicine and the availability of in person appointments. The patient expressed understanding and agreed to proceed.  History of Present Illness:    Observations/Objective:   Assessment and Plan: Penny Abbott was seen today for follow-up.  Diagnoses and all orders for this visit:  Iron deficiency anemia secondary to inadequate dietary iron intake -     Iron, TIBC and Ferritin Panel; Future  Hair loss -     Cancel: TSH; Future -     TSH; Future    Follow Up Instructions: See avs   I discussed the assessment and treatment plan with the patient. The patient was provided an opportunity to ask questions and all were answered. The patient agreed with the plan and demonstrated an understanding of the instructions.   The patient was advised to call back or seek an in-person evaluation if the symptoms worsen or if the condition fails to improve as anticipated.   Wilfred Lacy, NP

## 2019-09-30 ENCOUNTER — Other Ambulatory Visit (INDEPENDENT_AMBULATORY_CARE_PROVIDER_SITE_OTHER): Payer: Managed Care, Other (non HMO)

## 2019-09-30 DIAGNOSIS — D508 Other iron deficiency anemias: Secondary | ICD-10-CM

## 2019-09-30 DIAGNOSIS — L659 Nonscarring hair loss, unspecified: Secondary | ICD-10-CM | POA: Diagnosis not present

## 2019-10-01 ENCOUNTER — Encounter: Payer: Self-pay | Admitting: Nurse Practitioner

## 2019-10-01 LAB — TSH: TSH: 1.08 mIU/L

## 2019-10-01 LAB — IRON,TIBC AND FERRITIN PANEL
%SAT: 9 % (calc) — ABNORMAL LOW (ref 16–45)
Ferritin: 7 ng/mL — ABNORMAL LOW (ref 16–154)
Iron: 36 ug/dL — ABNORMAL LOW (ref 40–190)
TIBC: 412 mcg/dL (calc) (ref 250–450)

## 2019-10-01 MED ORDER — FERROUS SULFATE 325 (65 FE) MG PO TABS: 325.0000 mg | ORAL_TABLET | Freq: Two times a day (BID) | ORAL | 5 refills | Status: DC

## 2019-10-01 NOTE — Assessment & Plan Note (Addendum)
Global loss, no bald spots. Straightens hair with heat Normal TSH, B12, folate, and vitamin D Persistent low iron. This could explain hair loss. Start ferrous sulfate 1tab BID and multivitamin 1tab daily Need to repeat cbc and iron panel in 57months Advise to stop heat application to hair.

## 2019-10-01 NOTE — Patient Instructions (Signed)
Normal TSH Persistent low iron. This could explain hair loss. Start ferrous sulfate 1tab BID and multivitamin 1tab daily Need to repeat cbc and iron panel in 3months  

## 2019-10-01 NOTE — Assessment & Plan Note (Signed)
Normal TSH Persistent low iron. This could explain hair loss. Start ferrous sulfate 1tab BID and multivitamin 1tab daily Need to repeat cbc and iron panel in 27months

## 2020-02-15 ENCOUNTER — Other Ambulatory Visit: Payer: Self-pay

## 2020-02-15 ENCOUNTER — Encounter: Payer: Self-pay | Admitting: Nurse Practitioner

## 2020-02-15 ENCOUNTER — Ambulatory Visit (INDEPENDENT_AMBULATORY_CARE_PROVIDER_SITE_OTHER): Payer: BC Managed Care – PPO | Admitting: Nurse Practitioner

## 2020-02-15 VITALS — BP 122/82 | HR 88 | Temp 97.7°F | Ht 66.0 in | Wt 143.8 lb

## 2020-02-15 DIAGNOSIS — N912 Amenorrhea, unspecified: Secondary | ICD-10-CM | POA: Diagnosis not present

## 2020-02-15 DIAGNOSIS — D508 Other iron deficiency anemias: Secondary | ICD-10-CM

## 2020-02-15 LAB — CBC WITH DIFFERENTIAL/PLATELET
Basophils Absolute: 0.1 10*3/uL (ref 0.0–0.1)
Basophils Relative: 1 % (ref 0.0–3.0)
Eosinophils Absolute: 0.1 10*3/uL (ref 0.0–0.7)
Eosinophils Relative: 0.9 % (ref 0.0–5.0)
HCT: 41 % (ref 36.0–46.0)
Hemoglobin: 13.7 g/dL (ref 12.0–15.0)
Lymphocytes Relative: 24.1 % (ref 12.0–46.0)
Lymphs Abs: 2.2 10*3/uL (ref 0.7–4.0)
MCHC: 33.3 g/dL (ref 30.0–36.0)
MCV: 89.2 fl (ref 78.0–100.0)
Monocytes Absolute: 0.9 10*3/uL (ref 0.1–1.0)
Monocytes Relative: 10.3 % (ref 3.0–12.0)
Neutro Abs: 5.8 10*3/uL (ref 1.4–7.7)
Neutrophils Relative %: 63.7 % (ref 43.0–77.0)
Platelets: 193 10*3/uL (ref 150.0–400.0)
RBC: 4.6 Mil/uL (ref 3.87–5.11)
RDW: 14.1 % (ref 11.5–15.5)
WBC: 9.1 10*3/uL (ref 4.0–10.5)

## 2020-02-15 LAB — POCT URINE PREGNANCY

## 2020-02-15 LAB — HCG, QUANTITATIVE, PREGNANCY: Quantitative HCG: 1931 m[IU]/mL

## 2020-02-15 NOTE — Patient Instructions (Addendum)
Go to lab for blood draw.  Maintain appt with OB.  Continue ferrous sulfate and prenatal supplements.  Commonly Asked Questions During Pregnancy  Cats: A parasite can be excreted in cat feces.  To avoid exposure you need to have another person empty the little box.  If you must empty the litter box you will need to wear gloves.  Wash your hands after handling your cat.  This parasite can also be found in raw or undercooked meat so this should also be avoided.  Colds, Sore Throats, Flu: Please check your medication sheet to see what you can take for symptoms.  If your symptoms are unrelieved by these medications please call the office.  Dental Work: Most any dental work Agricultural consultant recommends is permitted.  X-rays should only be taken during the first trimester if absolutely necessary.  Your abdomen should be shielded with a lead apron during all x-rays.  Please notify your provider prior to receiving any x-rays.  Novocaine is fine; gas is not recommended.  If your dentist requires a note from [place] prior to dental work please call the office and we will provide one for you.  Exercise: Exercise is an important part of staying healthy during your pregnancy.  You may continue most exercises you were accustomed to prior to pregnancy.  Later in your pregnancy you will most likely notice you have difficulty with activities requiring balance like riding a bicycle.  It is important that you listen to your body and avoid activities that put you at a higher risk of falling.  Adequate rest and staying well hydrated are a must!  If you have questions about the safety of specific activities ask your provider.    Exposure to Children with illness: Try to avoid obvious exposure; report any symptoms to [place] when noted,  If you have chicken pos, red measles or mumps, you should be immune to these diseases.   Please do not take any vaccines while pregnant unless you have checked with your OB provider.  Fetal  Movement: After 28 weeks we recommend you do "kick counts" twice daily.  Lie or sit down in a calm quiet environment and count your baby movements "kicks".  You should feel your baby at least 10 times per hour.  If you have not felt 10 kicks within the first hour get up, walk around and have something sweet to eat or drink then repeat for an additional hour.  If count remains less than 10 per hour notify your provider.  Fumigating: Follow your pest control agent's advice as to how long to stay out of your home.  Ventilate the area well before re-entering.  Hemorrhoids:   Most over-the-counter preparations can be used during pregnancy.  Check your medication to see what is safe to use.  It is important to use a stool softener or fiber in your diet and to drink lots of liquids.  If hemorrhoids seem to be getting worse please call the office.   Hot Tubs:  Hot tubs Jacuzzis and saunas are not recommended while pregnant.  These increase your internal body temperature and should be avoided.  Intercourse:  Sexual intercourse is safe during pregnancy as long as you are comfortable, unless otherwise advised by your provider.  Spotting may occur after intercourse; report any bright red bleeding that is heavier than spotting.  Labor:  If you know that you are in labor, please go to the hospital.  If you are unsure, please call the office  and let us help you decide what to do.  Lifting, straining, etc:  If your job requires heavy lifting or straining please check with your provider for any limitations.  Generally, you should not lift items heavier than that you can lift simply with your hands and arms (no back muscles)  Painting:  Paint fumes do not harm your pregnancy, but may make you ill and should be avoided if possible.  Latex or water based paints have less odor than oils.  Use adequate ventilation while painting.  Permanents & Hair Color:  Chemicals in hair dyes are not recommended as they cause increase  hair dryness which can increase hair loss during pregnancy.  " Highlighting" and permanents are allowed.  Dye may be absorbed differently and permanents may not hold as well during pregnancy.  Sunbathing:  Use a sunscreen, as skin burns easily during pregnancy.  Drink plenty of fluids; avoid over heating.  Tanning Beds:  Because their possible side effects are still unknown, tanning beds are not recommended.  Ultrasound Scans:  Routine ultrasounds are performed at approximately 20 weeks.  You will be able to see your baby's general anatomy an if you would like to know the gender this can usually be determined as well.  If it is questionable when you conceived you may also receive an ultrasound early in your pregnancy for dating purposes.  Otherwise ultrasound exams are not routinely performed unless there is a medical necessity.  Although you can request a scan we ask that you pay for it when conducted because insurance does not cover " patient request" scans.  Work: If your pregnancy proceeds without complications you may work until your due date, unless your physician or employer advises otherwise.  Round Ligament Pain/Pelvic Discomfort:  Sharp, shooting pains not associated with bleeding are fairly common, usually occurring in the second trimester of pregnancy.  They tend to be worse when standing up or when you remain standing for long periods of time.  These are the result of pressure of certain pelvic ligaments called "round ligaments".  Rest, Tylenol and heat seem to be the most effective relief.  As the womb and fetus grow, they rise out of the pelvis and the discomfort improves.  Please notify the office if your pain seems different than that described.  It may represent a more serious condition.   Eating Plan for Pregnant Women While you are pregnant, your body requires additional nutrition to help support your growing baby. You also have a higher need for some vitamins and minerals, such as  folic acid, calcium, iron, and vitamin D. Eating a healthy, well-balanced diet is very important for your health and your baby's health. Your need for extra calories varies for the three 15-month segments of your pregnancy (trimesters). For most women, it is recommended to consume:  150 extra calories a day during the first trimester.  300 extra calories a day during the second trimester.  300 extra calories a day during the third trimester. What are tips for following this plan?   Do not try to lose weight or go on a diet during pregnancy.  Limit your overall intake of foods that have "empty calories." These are foods that have little nutritional value, such as sweets, desserts, candies, and sugar-sweetened beverages.  Eat a variety of foods (especially fruits and vegetables) to get a full range of vitamins and minerals.  Take a prenatal vitamin to help meet your additional vitamin and mineral needs during pregnancy, specifically  for folic acid, iron, calcium, and vitamin D.  Remember to stay active. Ask your health care provider what types of exercise and activities are safe for you.  Practice good food safety and cleanliness. Wash your hands before you eat and after you prepare raw meat. Wash all fruits and vegetables well before peeling or eating. Taking these actions can help to prevent food-borne illnesses that can be very dangerous to your baby, such as listeriosis. Ask your health care provider for more information about listeriosis. What does 150 extra calories look like? Healthy options that provide 150 extra calories each day could be any of the following:  6-8 oz (170-230 g) of plain low-fat yogurt with  cup of berries.  1 apple with 2 teaspoons (11 g) of peanut butter.  Cut-up vegetables with  cup (60 g) of hummus.  8 oz (230 mL) or 1 cup of low-fat chocolate milk.  1 stick of string cheese with 1 medium orange.  1 peanut butter and jelly sandwich that is made with one  slice of whole-wheat bread and 1 tsp (5 g) of peanut butter. For 300 extra calories, you could eat two of those healthy options each day. What is a healthy amount of weight to gain? The right amount of weight gain for you is based on your BMI before you became pregnant. If your BMI:  Was less than 18 (underweight), you should gain 28-40 lb (13-18 kg).  Was 18-24.9 (normal), you should gain 25-35 lb (11-16 kg).  Was 25-29.9 (overweight), you should gain 15-25 lb (7-11 kg).  Was 30 or greater (obese), you should gain 11-20 lb (5-9 kg). What if I am having twins or multiples? Generally, if you are carrying twins or multiples:  You may need to eat 300-600 extra calories a day.  The recommended range for total weight gain is 25-54 lb (11-25 kg), depending on your BMI before pregnancy.  Talk with your health care provider to find out about nutritional needs, weight gain, and exercise that is right for you. What foods can I eat?  Fruits All fruits. Eat a variety of colors and types of fruit. Remember to wash your fruits well before peeling or eating. Vegetables All vegetables. Eat a variety of colors and types of vegetables. Remember to wash your vegetables well before peeling or eating. Grains All grains. Choose whole grains, such as whole-wheat bread, oatmeal, or brown rice. Meats and other protein foods Lean meats, including chicken, [place], fish, and lean cuts of beef, veal, or pork. If you eat fish or seafood, choose options that are higher in omega-3 fatty acids and lower in mercury, such as salmon, herring, mussels, trout, sardines, pollock, shrimp, crab, and lobster. Tofu. Tempeh. Beans. Eggs. Peanut butter and other nut butters. Make sure that all meats, poultry, and eggs are cooked to food-safe temperatures or "well-done." Two or more servings of fish are recommended each week in order to get the most benefits from omega-3 fatty acids that are found in seafood. Choose fish that are  lower in mercury. You can find more information online:  PumpkinSearch.com.ee Dairy Pasteurized milk and milk alternatives (such as almond milk). Pasteurized yogurt and pasteurized cheese. Cottage cheese. Sour cream. Beverages Water. Juices that contain 100% fruit juice or vegetable juice. Caffeine-free teas and decaffeinated coffee. Drinks that contain caffeine are okay to drink, but it is better to avoid caffeine. Keep your total caffeine intake to less than 200 mg each day (which is 12 oz or 355 mL  of coffee, tea, or soda) or the limit as told by your health care provider. Fats and oils Fats and oils are okay to include in moderation. Sweets and desserts Sweets and desserts are okay to include in moderation. Seasoning and other foods All pasteurized condiments. The items listed above may not be a complete list of foods and beverages you can eat. Contact a dietitian for more information. What foods are not recommended? Fruits Unpasteurized fruit juices. Vegetables Raw (unpasteurized) vegetable juices. Meats and other protein foods Lunch meats, bologna, hot dogs, or other deli meats. (If you must eat those meats, reheat them until they are steaming hot.) Refrigerated pat, meat spreads from a meat counter, smoked seafood that is found in the refrigerated section of a store. Raw or undercooked meats, poultry, and eggs. Raw fish, such as sushi or sashimi. Fish that have high mercury content, such as tilefish, shark, swordfish, and king mackerel. To learn more about mercury in fish, talk with your health care provider or look for online resources, such as:  PumpkinSearch.com.ee Dairy Raw (unpasteurized) milk and any foods that have raw milk in them. Soft cheeses, such as feta, queso blanco, queso fresco, Brie, Camembert cheeses, blue-veined cheeses, and Panela cheese (unless it is made with pasteurized milk, which must be stated on the label). Beverages Alcohol. Sugar-sweetened beverages, such as sodas,  teas, or energy drinks. Seasoning and other foods Homemade fermented foods and drinks, such as pickles, sauerkraut, or kombucha drinks. (Store-bought pasteurized versions of these are okay.) Salads that are made in a store or deli, such as ham salad, chicken salad, egg salad, tuna salad, and seafood salad. The items listed above may not be a complete list of foods and beverages you should avoid. Contact a dietitian for more information. Where to find more information To calculate the number of calories you need based on your height, weight, and activity level, you can use an online calculator such as:  PackageNews.is To calculate how much weight you should gain during pregnancy, you can use an online pregnancy weight gain calculator such as:  http://jones-berg.com/ Summary  While you are pregnant, your body requires additional nutrition to help support your growing baby.  Eat a variety of foods, especially fruits and vegetables to get a full range of vitamins and minerals.  Practice good food safety and cleanliness. Wash your hands before you eat and after you prepare raw meat. Wash all fruits and vegetables well before peeling or eating. Taking these actions can help to prevent food-borne illnesses, such as listeriosis, that can be very dangerous to your baby.  Do not eat raw meat or fish. Do not eat fish that have high mercury content, such as tilefish, shark, swordfish, and king mackerel. Do not eat unpasteurized (raw) dairy.  Take a prenatal vitamin to help meet your additional vitamin and mineral needs during pregnancy, specifically for folic acid, iron, calcium, and vitamin D. This information is not intended to replace advice given to you by your health care provider. Make sure you discuss any questions you have with your health care provider. Document Revised: 05/07/2019 Document Reviewed: 09/13/2017 Elsevier Patient Education  2020  [employer].   Maintain upcoming appt with OB. Continue prenatal vitamins

## 2020-02-15 NOTE — Progress Notes (Signed)
Subjective:  Patient ID: Penny Abbott, female    DOB: 10-02-1991  Age: 29 y.o. MRN: [id number]  CC: Follow-up (follow up on low iron-has been taking otc-cant tell if hair loss if better or worse/had positive home pregnacy test--has appt with GYN next week (expecting) and dermatology in 02/2020. )  HPI Penny Abbott present with amenorrhea and positive home pregnanacy test. She reports pregnancy was planned. LMP 01/11/2020. She denies any vaginal bleeding or pelvic pain or discharge or dysuria or dizziness or nausea or constipation. This is her first pregnancy. She start prenatal multivitamins and continues oral iron supplement. She is married and feels safe at home.  Reviewed past Medical, Social and Family history today.  Outpatient Medications Prior to Visit  Medication Sig Dispense Refill  . ferrous sulfate 325 (65 FE) MG tablet Take 1 tablet (325 mg total) by mouth 2 (two) times daily with a meal. 60 tablet 5  . Prenatal Vit-Fe Fumarate-FA (PRENATAL MULTIVITAMIN) TABS tablet Take 1 tablet by mouth daily at 12 noon.     No facility-administered medications prior to visit.    ROS See HPI  Objective:  BP 122/82   Pulse 88   Temp 97.7 F (36.5 C) (Tympanic)   Ht 5\' 6"  (1.676 m)   Wt 143 lb 12.8 oz (65.2 kg)   LMP 01/11/2020   SpO2 100%   BMI 23.21 kg/m   BP Readings from Last 3 Encounters:  02/15/20 122/82  02/18/19 124/80  02/03/19 110/70   Wt Readings from Last 3 Encounters:  02/15/20 143 lb 12.8 oz (65.2 kg)  02/18/19 136 lb (61.7 kg)  02/03/19 133 lb 6 oz (60.5 kg)    Physical Exam Cardiovascular:     Rate and Rhythm: Normal rate.     Pulses: Normal pulses.  Neurological:     Mental Status: She is alert and oriented to person, place, and time.  Psychiatric:        Mood and Affect: Mood normal.        Behavior: Behavior normal.        Thought Content: Thought content normal.    Lab Results  Component Value Date   WBC 9.1 02/15/2020   HGB 13.7  02/15/2020   HCT 41.0 02/15/2020   PLT 193.0 02/15/2020   GLUCOSE 91 11/23/2014   ALT 13 11/23/2014   AST 17 11/23/2014   NA 136 (L) 11/23/2014   K 4.0 11/23/2014   CL 101 11/23/2014   CREATININE 0.53 11/23/2014   BUN 8 11/23/2014   CO2 22 11/23/2014   TSH 1.08 09/30/2019   INR 1.24 11/23/2014   Assessment & Plan:  This visit occurred during the SARS-CoV-2 public health emergency.  Safety protocols were in place, including screening questions prior to the visit, additional usage of staff PPE, and extensive cleaning of exam room while observing appropriate contact time as indicated for disinfecting solutions.   Penny Abbott was seen today for follow-up.  Diagnoses and all orders for this visit:  Amenorrhea -     POCT urine pregnancy -     B-HCG Quant  Iron deficiency anemia secondary to inadequate dietary iron intake -     Iron, TIBC and Ferritin Panel -     CBC w/Diff   I am having Penny Abbott maintain her prenatal multivitamin and ferrous sulfate.  No orders of the defined types were placed in this encounter.   Problem List Items Addressed This Visit      Other   Iron  deficiency anemia secondary to inadequate dietary iron intake   Relevant Orders   Iron, TIBC and Ferritin Panel (Completed)   CBC w/Diff (Completed)    Other Visit Diagnoses    Amenorrhea    -  Primary   Relevant Orders   POCT urine pregnancy (Completed)   B-HCG Quant (Completed)      Follow-up: Return if symptoms worsen or fail to improve.  Wilfred Lacy, NP

## 2020-02-16 LAB — IRON,TIBC AND FERRITIN PANEL
%SAT: 27 % (calc) (ref 16–45)
Ferritin: 30 ng/mL (ref 16–154)
Iron: 102 ug/dL (ref 40–190)
TIBC: 375 mcg/dL (calc) (ref 250–450)

## 2020-02-18 ENCOUNTER — Encounter: Payer: Self-pay | Admitting: Nurse Practitioner

## 2020-03-08 DIAGNOSIS — Z3480 Encounter for supervision of other normal pregnancy, unspecified trimester: Secondary | ICD-10-CM | POA: Diagnosis not present

## 2020-03-08 DIAGNOSIS — O26851 Spotting complicating pregnancy, first trimester: Secondary | ICD-10-CM | POA: Diagnosis not present

## 2020-03-08 DIAGNOSIS — O26891 Other specified pregnancy related conditions, first trimester: Secondary | ICD-10-CM | POA: Diagnosis not present

## 2020-03-08 DIAGNOSIS — Z3201 Encounter for pregnancy test, result positive: Secondary | ICD-10-CM | POA: Diagnosis not present

## 2020-03-10 LAB — OB RESULTS CONSOLE HIV ANTIBODY (ROUTINE TESTING): HIV: NONREACTIVE

## 2020-03-10 LAB — OB RESULTS CONSOLE HEPATITIS B SURFACE ANTIGEN: Hepatitis B Surface Ag: NEGATIVE

## 2020-03-10 LAB — OB RESULTS CONSOLE ANTIBODY SCREEN: Antibody Screen: NEGATIVE

## 2020-03-10 LAB — OB RESULTS CONSOLE ABO/RH: RH Type: POSITIVE

## 2020-03-10 LAB — OB RESULTS CONSOLE RPR: RPR: NONREACTIVE

## 2020-03-10 LAB — OB RESULTS CONSOLE RUBELLA ANTIBODY, IGM: Rubella: IMMUNE

## 2020-04-05 DIAGNOSIS — Z3482 Encounter for supervision of other normal pregnancy, second trimester: Secondary | ICD-10-CM | POA: Diagnosis not present

## 2020-04-27 DIAGNOSIS — O26892 Other specified pregnancy related conditions, second trimester: Secondary | ICD-10-CM | POA: Diagnosis not present

## 2020-04-27 DIAGNOSIS — Z3A15 15 weeks gestation of pregnancy: Secondary | ICD-10-CM | POA: Diagnosis not present

## 2020-04-27 DIAGNOSIS — R109 Unspecified abdominal pain: Secondary | ICD-10-CM | POA: Diagnosis not present

## 2020-05-10 DIAGNOSIS — Z3401 Encounter for supervision of normal first pregnancy, first trimester: Secondary | ICD-10-CM | POA: Diagnosis not present

## 2020-06-14 DIAGNOSIS — Z36 Encounter for antenatal screening for chromosomal anomalies: Secondary | ICD-10-CM | POA: Diagnosis not present

## 2020-07-14 DIAGNOSIS — Z3482 Encounter for supervision of other normal pregnancy, second trimester: Secondary | ICD-10-CM | POA: Diagnosis not present

## 2020-08-11 DIAGNOSIS — Z23 Encounter for immunization: Secondary | ICD-10-CM | POA: Diagnosis not present

## 2020-09-08 DIAGNOSIS — O26843 Uterine size-date discrepancy, third trimester: Secondary | ICD-10-CM | POA: Diagnosis not present

## 2020-09-21 DIAGNOSIS — Z3483 Encounter for supervision of other normal pregnancy, third trimester: Secondary | ICD-10-CM | POA: Diagnosis not present

## 2020-09-28 DIAGNOSIS — Z3483 Encounter for supervision of other normal pregnancy, third trimester: Secondary | ICD-10-CM | POA: Diagnosis not present

## 2020-10-03 ENCOUNTER — Encounter (HOSPITAL_COMMUNITY): Payer: Self-pay | Admitting: *Deleted

## 2020-10-03 ENCOUNTER — Encounter (HOSPITAL_COMMUNITY): Payer: Self-pay

## 2020-10-03 NOTE — Patient Instructions (Signed)
Penny Abbott  10/03/2020   Your procedure is scheduled on:  10/11/2020  Arrive at 0530 at Entrance C on [address] at [facility]  and [employer]. You are invited to use the FREE valet parking or use the Visitor's parking deck.  Pick up the phone at the desk and dial [phone].  Call this number if you have problems the morning of surgery: [phone]  Remember:   Do not eat food:(After Midnight) Desps de medianoche.  Do not drink clear liquids: (After Midnight) Desps de medianoche.  Take these medicines the morning of surgery with A SIP OF WATER:  none   Do not wear jewelry, make-up or nail polish.  Do not wear lotions, powders, or perfumes. Do not wear deodorant.  Do not shave 48 hours prior to surgery.  Do not bring valuables to the hospital.  [facility] is not   responsible for any belongings or valuables brought to the hospital.  Contacts, dentures or bridgework may not be worn into surgery.  Leave suitcase in the car. After surgery it may be brought to your room.  For patients admitted to the hospital, checkout time is 11:00 AM the day of              discharge.      Please read over the following fact sheets that you were given:     Preparing for Surgery

## 2020-10-07 NOTE — H&P (Signed)
HPI: 29 y.o. y/o G2P1001 @ [redacted]w[redacted]d  estimated gestational age (as dated by LMP c/w 8 week ultrasound) is admitted for repeat cesarean section.    No Leaking of Fluid,   no Vaginal Bleeding,   no Uterine Contractions,  Good Fetal Movement.  Prenatal care has been provided by Dr. Myna Hidalgo (transition of care to Dr. Steva Ready)  ROS: Denies fevers, chills, chest pain, visual changes, SOB, RUQ/epigastric pain, N/V, dysuria, hematuria, or sudden onset/worsening bilateral LE or facial edema.  Pregnancy complicated by: History of postpartum hemorrhage requiring D&C and blood transfusion Leukocytosis (noted on CBCs obtained this pregnancy) - highest value WBC 15.6   Prenatal Transfer Tool  Maternal Diabetes: No Genetic Screening: Normal Maternal Ultrasounds/Referrals: Normal Fetal Ultrasounds or other Referrals:  None Maternal Substance Abuse:  No Significant Maternal Medications:  None Significant Maternal Lab Results: Group B Strep negative   PNL:  GBS neg, Rub Immune, Hep B neg, RPR NR, HIV neg, GC/C neg, glucola: 84 (wnl) H/H: 12.3/36.0 (09/28/20) Blood type: A Positive  Immunizations: Tdap: Given prenatally (08/11/20) Flu: Has not had  OBHx: G2P1001 G1:  [redacted]w[redacted]d, labor, pushed x 3 hours (complete/+2 station), required CS, postpartum hemorrhage occurred requiring D&C and blood transfusion PMHx:  None Meds:  PNV Allergy:  No Known Allergies SurgHx: Cesarean section SocHx:   Denies Tobacco, denies  EtOH, denies Illicit Drugs  O: LMP 01/11/2020  Gen. AAOx3, NAD CV.  RRR  No murmur.  Resp. CTAB, no wheeze or crackles. Abd. Gravid,  no tenderness,  no rigidity,  no guarding Extr.  no edema B/L , no calf tenderness, no Homan's B/L FHT:  Toco:  SVE: deferred   Placental location:  Posterior  Labs: see orders  A/P:  29 y.o. G2P1001 @ [redacted]w[redacted]d EGA who presents for repeat cesarean section.  - Admit to L&D - Admit labs (CBC, T&S, COVID) - CEFM/Toco - Diet:  NPO - IVF:  Per  routine - VTE Prophylaxis:  SCDs - GBS Status:  Negative - Antibiotics:  Ancef 2g on call to OR - Shohl's on call to OR - Consented for C/S and blood products  Consents: I have explained to the patient that this surgery is performed to deliver their baby or babies through an incision in the abdomen and incision in the uterus.  Prior to surgery, the risks and benefits of the surgery, as well as alternative treatments were discussed.  The risks include, but are not limited to, possible need for cesarean delivery for all future pregnancies, bleeding at the time of surgery that could necessitate a blood transfusion and/or hysterectomy, rupture of the uterus during a future pregnancy that could cause a preterm delivery and/or requiring hysterectomy, infection, damage to surrounding organs and tissues, damage to bladder, damage to ureters, causing kidney damage, and requiring additional procedures, damage to bowels, resulting in further surgery, postoperative pain, short-term and long-term, scarring on the abdominal wall and intra-abdominally, need for further surgery, development of an incisional hernia, deep vein thrombosis and/or pulmonary embolism, wound infection and/or separation, painful intercourse, urinary leakage, impact on future pregnancies including but not limited to, abnormal location or attachment of the placenta to the uterus, such as placenta previa or accreta, that may necessitate a blood transfusion and/or hysterectomy, impact on total family size, complications the course of which cannot be predicted or prevented, and death. Patient was consented for blood products.  The patient is aware that bleeding may result in the need for a blood transfusion which includes risk  of transmission of HIV (1:2 million), Hepatitis C (1:2 million), and Hepatitis B (1:200 thousand) and transfusion reaction.  Patient voiced understanding of the above risks as well as understanding of indications for blood  transfusion.   Steva Ready, DO [phone] (office)

## 2020-10-09 ENCOUNTER — Other Ambulatory Visit (HOSPITAL_COMMUNITY)
Admission: RE | Admit: 2020-10-09 | Discharge: 2020-10-09 | Disposition: A | Discharge status: Home / Self Care | Payer: BC Managed Care – PPO | Source: Ambulatory Visit | Attending: Obstetrics and Gynecology | Admitting: Obstetrics and Gynecology

## 2020-10-09 DIAGNOSIS — Z3A39 39 weeks gestation of pregnancy: Secondary | ICD-10-CM | POA: Diagnosis not present

## 2020-10-09 DIAGNOSIS — Z23 Encounter for immunization: Secondary | ICD-10-CM | POA: Diagnosis not present

## 2020-10-09 DIAGNOSIS — O9902 Anemia complicating childbirth: Secondary | ICD-10-CM | POA: Diagnosis not present

## 2020-10-09 DIAGNOSIS — Z20822 Contact with and (suspected) exposure to covid-19: Secondary | ICD-10-CM | POA: Insufficient documentation

## 2020-10-09 DIAGNOSIS — O99013 Anemia complicating pregnancy, third trimester: Secondary | ICD-10-CM | POA: Diagnosis not present

## 2020-10-09 DIAGNOSIS — O34211 Maternal care for low transverse scar from previous cesarean delivery: Secondary | ICD-10-CM | POA: Diagnosis not present

## 2020-10-09 DIAGNOSIS — Z98891 History of uterine scar from previous surgery: Secondary | ICD-10-CM | POA: Diagnosis not present

## 2020-10-09 DIAGNOSIS — D509 Iron deficiency anemia, unspecified: Secondary | ICD-10-CM | POA: Diagnosis not present

## 2020-10-09 HISTORY — DX: Encounter for other specified aftercare: Z51.89

## 2020-10-09 HISTORY — DX: Supervision of pregnancy with other poor reproductive or obstetric history, unspecified trimester: O09.299

## 2020-10-09 LAB — CBC
HCT: 38 % (ref 36.0–46.0)
Hemoglobin: 12.7 g/dL (ref 12.0–15.0)
MCH: 31.5 pg (ref 26.0–34.0)
MCHC: 33.4 g/dL (ref 30.0–36.0)
MCV: 94.3 fL (ref 80.0–100.0)
Platelets: 152 10*3/uL (ref 150–400)
RBC: 4.03 MIL/uL (ref 3.87–5.11)
RDW: 14.9 % (ref 11.5–15.5)
WBC: 12.3 10*3/uL — ABNORMAL HIGH (ref 4.0–10.5)
nRBC: 0.2 % (ref 0.0–0.2)

## 2020-10-09 LAB — RESPIRATORY PANEL BY RT PCR (FLU A&B, COVID)
Influenza A by PCR: NEGATIVE
Influenza B by PCR: NEGATIVE
SARS Coronavirus 2 by RT PCR: NEGATIVE

## 2020-10-09 LAB — RPR: RPR Ser Ql: NONREACTIVE

## 2020-10-09 NOTE — MAU Note (Signed)
Pt here for covid swab and lab draw. Denies covid symptoms or sick contacts. Swab collected.

## 2020-10-11 ENCOUNTER — Inpatient Hospital Stay (HOSPITAL_COMMUNITY): Payer: BC Managed Care – PPO

## 2020-10-11 ENCOUNTER — Other Ambulatory Visit: Payer: Self-pay

## 2020-10-11 ENCOUNTER — Inpatient Hospital Stay (HOSPITAL_COMMUNITY): Payer: BC Managed Care – PPO | Admitting: Certified Registered Nurse Anesthetist

## 2020-10-11 ENCOUNTER — Inpatient Hospital Stay (HOSPITAL_COMMUNITY): Payer: BC Managed Care – PPO | Admitting: Certified Registered"

## 2020-10-11 ENCOUNTER — Encounter (HOSPITAL_COMMUNITY): Payer: Self-pay | Admitting: Obstetrics and Gynecology

## 2020-10-11 ENCOUNTER — Inpatient Hospital Stay (HOSPITAL_COMMUNITY)
Admission: RE | Admit: 2020-10-11 | Payer: BC Managed Care – PPO | Source: Home / Self Care | Admitting: Obstetrics and Gynecology

## 2020-10-11 ENCOUNTER — Inpatient Hospital Stay (HOSPITAL_COMMUNITY)
Admission: AD | Admit: 2020-10-11 | Discharge: 2020-10-14 | DRG: 787 | Disposition: A | Discharge status: Home / Self Care | Payer: BC Managed Care – PPO | Attending: Obstetrics and Gynecology | Admitting: Obstetrics and Gynecology

## 2020-10-11 ENCOUNTER — Encounter (HOSPITAL_COMMUNITY)
Admission: AD | Disposition: A | Discharge status: Home / Self Care | Payer: Self-pay | Source: Home / Self Care | Attending: Obstetrics and Gynecology

## 2020-10-11 DIAGNOSIS — Z3A39 39 weeks gestation of pregnancy: Secondary | ICD-10-CM | POA: Diagnosis not present

## 2020-10-11 DIAGNOSIS — Z20822 Contact with and (suspected) exposure to covid-19: Secondary | ICD-10-CM | POA: Diagnosis present

## 2020-10-11 DIAGNOSIS — D509 Iron deficiency anemia, unspecified: Secondary | ICD-10-CM | POA: Diagnosis present

## 2020-10-11 DIAGNOSIS — O9902 Anemia complicating childbirth: Secondary | ICD-10-CM | POA: Diagnosis present

## 2020-10-11 DIAGNOSIS — O34219 Maternal care for unspecified type scar from previous cesarean delivery: Secondary | ICD-10-CM | POA: Diagnosis present

## 2020-10-11 DIAGNOSIS — O99013 Anemia complicating pregnancy, third trimester: Secondary | ICD-10-CM | POA: Diagnosis not present

## 2020-10-11 DIAGNOSIS — Z98891 History of uterine scar from previous surgery: Secondary | ICD-10-CM

## 2020-10-11 DIAGNOSIS — O34211 Maternal care for low transverse scar from previous cesarean delivery: Secondary | ICD-10-CM | POA: Diagnosis present

## 2020-10-11 HISTORY — PX: DILATION AND EVACUATION: SHX1459

## 2020-10-11 LAB — POSTPARTUM HEMORRHAGE PROTOCOL (BB NOTIFICATION)

## 2020-10-11 LAB — DIC (DISSEMINATED INTRAVASCULAR COAGULATION)PANEL
D-Dimer, Quant: 10.14 ug/mL-FEU — ABNORMAL HIGH (ref 0.00–0.50)
Fibrinogen: 233 mg/dL (ref 210–475)
INR: 1.1 (ref 0.8–1.2)
Platelets: 141 10*3/uL — ABNORMAL LOW (ref 150–400)
Prothrombin Time: 13.4 seconds (ref 11.4–15.2)
Smear Review: NONE SEEN
aPTT: 29 seconds (ref 24–36)

## 2020-10-11 LAB — CBC
HCT: 38.2 % (ref 36.0–46.0)
Hemoglobin: 12.4 g/dL (ref 12.0–15.0)
MCH: 31.2 pg (ref 26.0–34.0)
MCHC: 32.5 g/dL (ref 30.0–36.0)
MCV: 96 fL (ref 80.0–100.0)
Platelets: 141 10*3/uL — ABNORMAL LOW (ref 150–400)
RBC: 3.98 MIL/uL (ref 3.87–5.11)
RDW: 14.8 % (ref 11.5–15.5)
WBC: 11 10*3/uL — ABNORMAL HIGH (ref 4.0–10.5)
nRBC: 0 % (ref 0.0–0.2)

## 2020-10-11 LAB — PREPARE RBC (CROSSMATCH)

## 2020-10-11 SURGERY — Surgical Case
Anesthesia: Spinal

## 2020-10-11 SURGERY — DILATION AND EVACUATION, UTERUS
Anesthesia: Spinal

## 2020-10-11 MED ORDER — DEXAMETHASONE SODIUM PHOSPHATE 10 MG/ML IJ SOLN
INTRAMUSCULAR | Status: DC | PRN
  Administered 2020-10-11: 10 mg via INTRAVENOUS

## 2020-10-11 MED ORDER — SENNOSIDES-DOCUSATE SODIUM 8.6-50 MG PO TABS
2.0000 | ORAL_TABLET | ORAL | Status: DC
  Administered 2020-10-11 – 2020-10-13 (×3): 2 via ORAL
  Filled 2020-10-11 (×3): qty 2

## 2020-10-11 MED ORDER — ONDANSETRON HCL 4 MG/2ML IJ SOLN: 4.0000 mg | Freq: Three times a day (TID) | INTRAMUSCULAR | Status: DC | PRN

## 2020-10-11 MED ORDER — ACETAMINOPHEN 10 MG/ML IV SOLN
INTRAVENOUS | Status: AC
  Filled 2020-10-11: qty 100

## 2020-10-11 MED ORDER — MISOPROSTOL 200 MCG PO TABS
ORAL_TABLET | ORAL | Status: AC
  Filled 2020-10-11: qty 5

## 2020-10-11 MED ORDER — FENTANYL CITRATE (PF) 100 MCG/2ML IJ SOLN
INTRAMUSCULAR | Status: AC
  Filled 2020-10-11: qty 2

## 2020-10-11 MED ORDER — MEPERIDINE HCL 25 MG/ML IJ SOLN: 6.2500 mg | INTRAMUSCULAR | Status: DC | PRN

## 2020-10-11 MED ORDER — CARBOPROST TROMETHAMINE 250 MCG/ML IM SOLN: 250.0000 ug | Freq: Once | INTRAMUSCULAR | Status: DC

## 2020-10-11 MED ORDER — DIPHENHYDRAMINE HCL 25 MG PO CAPS: 25.0000 mg | ORAL_CAPSULE | Freq: Four times a day (QID) | ORAL | Status: DC | PRN

## 2020-10-11 MED ORDER — MENTHOL 3 MG MT LOZG: 1.0000 | LOZENGE | OROMUCOSAL | Status: DC | PRN

## 2020-10-11 MED ORDER — DIPHENOXYLATE-ATROPINE 2.5-0.025 MG PO TABS
ORAL_TABLET | ORAL | Status: AC
  Filled 2020-10-11: qty 2

## 2020-10-11 MED ORDER — COCONUT OIL OIL: 1.0000 "application " | TOPICAL_OIL | Status: DC | PRN

## 2020-10-11 MED ORDER — OXYTOCIN 10 UNIT/ML IJ SOLN
INTRAMUSCULAR | Status: DC | PRN
  Administered 2020-10-11 (×2): 10 [IU] via INTRAMUSCULAR

## 2020-10-11 MED ORDER — LACTATED RINGERS IV SOLN: INTRAVENOUS | Status: DC | PRN

## 2020-10-11 MED ORDER — FENTANYL CITRATE (PF) 100 MCG/2ML IJ SOLN: 25.0000 ug | INTRAMUSCULAR | Status: DC | PRN

## 2020-10-11 MED ORDER — METHYLERGONOVINE MALEATE 0.2 MG/ML IJ SOLN
INTRAMUSCULAR | Status: DC | PRN
  Administered 2020-10-11: 0.2 mg via INTRAMUSCULAR

## 2020-10-11 MED ORDER — NALOXONE HCL 4 MG/10ML IJ SOLN
1.0000 ug/kg/h | INTRAVENOUS | Status: DC | PRN
  Filled 2020-10-11: qty 5

## 2020-10-11 MED ORDER — SIMETHICONE 80 MG PO CHEW
80.0000 mg | CHEWABLE_TABLET | Freq: Three times a day (TID) | ORAL | Status: DC
  Administered 2020-10-11 – 2020-10-14 (×8): 80 mg via ORAL
  Filled 2020-10-11 (×8): qty 1

## 2020-10-11 MED ORDER — TRANEXAMIC ACID-NACL 1000-0.7 MG/100ML-% IV SOLN
INTRAVENOUS | Status: AC
  Filled 2020-10-11: qty 100

## 2020-10-11 MED ORDER — SODIUM CHLORIDE 0.9 % IV SOLN: INTRAVENOUS | Status: DC | PRN

## 2020-10-11 MED ORDER — DIPHENHYDRAMINE HCL 25 MG PO CAPS
25.0000 mg | ORAL_CAPSULE | ORAL | Status: DC | PRN
  Administered 2020-10-11 – 2020-10-12 (×2): 25 mg via ORAL
  Filled 2020-10-11 (×2): qty 1

## 2020-10-11 MED ORDER — DIPHENOXYLATE-ATROPINE 2.5-0.025 MG PO TABS
2.0000 | ORAL_TABLET | Freq: Once | ORAL | Status: AC
  Administered 2020-10-11: 2 via ORAL

## 2020-10-11 MED ORDER — SCOPOLAMINE 1 MG/3DAYS TD PT72
MEDICATED_PATCH | TRANSDERMAL | Status: AC
  Filled 2020-10-11: qty 1

## 2020-10-11 MED ORDER — OXYCODONE HCL 5 MG/5ML PO SOLN: 5.0000 mg | Freq: Once | ORAL | Status: DC | PRN

## 2020-10-11 MED ORDER — HYDROMORPHONE HCL 1 MG/ML IJ SOLN: 0.2500 mg | INTRAMUSCULAR | Status: DC | PRN

## 2020-10-11 MED ORDER — SCOPOLAMINE 1 MG/3DAYS TD PT72
1.0000 | MEDICATED_PATCH | Freq: Once | TRANSDERMAL | Status: AC
  Administered 2020-10-11: 1.5 mg via TRANSDERMAL

## 2020-10-11 MED ORDER — PRENATAL MULTIVITAMIN CH
1.0000 | ORAL_TABLET | Freq: Every day | ORAL | Status: DC
  Administered 2020-10-12 – 2020-10-13 (×2): 1 via ORAL
  Filled 2020-10-11 (×2): qty 1

## 2020-10-11 MED ORDER — TRANEXAMIC ACID-NACL 1000-0.7 MG/100ML-% IV SOLN
1000.0000 mg | INTRAVENOUS | Status: AC
  Administered 2020-10-11: 1000 mg via INTRAVENOUS

## 2020-10-11 MED ORDER — NALBUPHINE HCL 10 MG/ML IJ SOLN
5.0000 mg | INTRAMUSCULAR | Status: DC | PRN
  Administered 2020-10-12: 5 mg via INTRAVENOUS
  Filled 2020-10-11: qty 1

## 2020-10-11 MED ORDER — SIMETHICONE 80 MG PO CHEW
80.0000 mg | CHEWABLE_TABLET | ORAL | Status: DC
  Administered 2020-10-11 – 2020-10-13 (×3): 80 mg via ORAL
  Filled 2020-10-11 (×3): qty 1

## 2020-10-11 MED ORDER — SOD CITRATE-CITRIC ACID 500-334 MG/5ML PO SOLN: 30.0000 mL | ORAL | Status: DC

## 2020-10-11 MED ORDER — OXYTOCIN-SODIUM CHLORIDE 30-0.9 UT/500ML-% IV SOLN
INTRAVENOUS | Status: DC | PRN
  Administered 2020-10-11: 500 mL via INTRAVENOUS

## 2020-10-11 MED ORDER — ONDANSETRON HCL 4 MG/2ML IJ SOLN: 4.0000 mg | Freq: Once | INTRAMUSCULAR | Status: DC | PRN

## 2020-10-11 MED ORDER — SODIUM CHLORIDE 0.9% IV SOLUTION: Freq: Once | INTRAVENOUS | Status: DC

## 2020-10-11 MED ORDER — FERROUS SULFATE 325 (65 FE) MG PO TABS
325.0000 mg | ORAL_TABLET | Freq: Two times a day (BID) | ORAL | Status: DC
  Administered 2020-10-12 – 2020-10-13 (×4): 325 mg via ORAL
  Filled 2020-10-11 (×4): qty 1

## 2020-10-11 MED ORDER — HYDROMORPHONE HCL 1 MG/ML IJ SOLN: 0.2000 mg | INTRAMUSCULAR | Status: DC | PRN

## 2020-10-11 MED ORDER — DIPHENHYDRAMINE HCL 50 MG/ML IJ SOLN: 12.5000 mg | INTRAMUSCULAR | Status: DC | PRN

## 2020-10-11 MED ORDER — OXYTOCIN-SODIUM CHLORIDE 30-0.9 UT/500ML-% IV SOLN
INTRAVENOUS | Status: AC
  Filled 2020-10-11: qty 500

## 2020-10-11 MED ORDER — CEFAZOLIN SODIUM-DEXTROSE 2-4 GM/100ML-% IV SOLN: 2.0000 g | INTRAVENOUS | Status: DC

## 2020-10-11 MED ORDER — LACTATED RINGERS IV SOLN: INTRAVENOUS | Status: DC

## 2020-10-11 MED ORDER — NALBUPHINE HCL 10 MG/ML IJ SOLN: 5.0000 mg | INTRAMUSCULAR | Status: DC | PRN

## 2020-10-11 MED ORDER — NALBUPHINE HCL 10 MG/ML IJ SOLN: 5.0000 mg | Freq: Once | INTRAMUSCULAR | Status: DC | PRN

## 2020-10-11 MED ORDER — LACTATED RINGERS IV BOLUS
1000.0000 mL | Freq: Once | INTRAVENOUS | Status: AC
  Administered 2020-10-11: 2500 mL via INTRAVENOUS
  Administered 2020-10-11 (×2): 400 mL via INTRAVENOUS

## 2020-10-11 MED ORDER — CARBOPROST TROMETHAMINE 250 MCG/ML IM SOLN
INTRAMUSCULAR | Status: AC
  Filled 2020-10-11: qty 1

## 2020-10-11 MED ORDER — KETOROLAC TROMETHAMINE 30 MG/ML IJ SOLN: 30.0000 mg | Freq: Once | INTRAMUSCULAR | Status: DC | PRN

## 2020-10-11 MED ORDER — TRANEXAMIC ACID-NACL 1000-0.7 MG/100ML-% IV SOLN
INTRAVENOUS | Status: DC | PRN
  Administered 2020-10-11: 1000 mg via INTRAVENOUS

## 2020-10-11 MED ORDER — OXYCODONE HCL 5 MG PO TABS: 5.0000 mg | ORAL_TABLET | Freq: Once | ORAL | Status: DC | PRN

## 2020-10-11 MED ORDER — FENTANYL CITRATE (PF) 100 MCG/2ML IJ SOLN
INTRAMUSCULAR | Status: DC | PRN
Start: 2020-10-11 — End: 2020-10-11
  Administered 2020-10-11: 15 ug via INTRATHECAL

## 2020-10-11 MED ORDER — SODIUM CHLORIDE 0.9% FLUSH: 3.0000 mL | INTRAVENOUS | Status: DC | PRN

## 2020-10-11 MED ORDER — OXYCODONE HCL 5 MG PO TABS: 5.0000 mg | ORAL_TABLET | ORAL | Status: DC | PRN

## 2020-10-11 MED ORDER — PHENYLEPHRINE HCL-NACL 20-0.9 MG/250ML-% IV SOLN
INTRAVENOUS | Status: DC | PRN
  Administered 2020-10-11: 60 ug/min via INTRAVENOUS

## 2020-10-11 MED ORDER — MORPHINE SULFATE (PF) 0.5 MG/ML IJ SOLN
INTRAMUSCULAR | Status: DC | PRN
Start: 2020-10-11 — End: 2020-10-11
  Administered 2020-10-11: 150 ug via INTRATHECAL

## 2020-10-11 MED ORDER — DEXAMETHASONE SODIUM PHOSPHATE 10 MG/ML IJ SOLN
INTRAMUSCULAR | Status: AC
  Filled 2020-10-11: qty 1

## 2020-10-11 MED ORDER — FAMOTIDINE IN NACL 20-0.9 MG/50ML-% IV SOLN
20.0000 mg | Freq: Once | INTRAVENOUS | Status: AC
  Administered 2020-10-11: 20 mg via INTRAVENOUS

## 2020-10-11 MED ORDER — WITCH HAZEL-GLYCERIN EX PADS: 1.0000 "application " | MEDICATED_PAD | CUTANEOUS | Status: DC | PRN

## 2020-10-11 MED ORDER — OXYTOCIN-SODIUM CHLORIDE 30-0.9 UT/500ML-% IV SOLN
INTRAVENOUS | Status: DC | PRN
  Administered 2020-10-11: 30 [IU] via INTRAVENOUS

## 2020-10-11 MED ORDER — SIMETHICONE 80 MG PO CHEW: 80.0000 mg | CHEWABLE_TABLET | ORAL | Status: DC | PRN

## 2020-10-11 MED ORDER — AMISULPRIDE (ANTIEMETIC) 5 MG/2ML IV SOLN
10.0000 mg | Freq: Once | INTRAVENOUS | Status: DC | PRN
  Filled 2020-10-11: qty 4

## 2020-10-11 MED ORDER — BUPIVACAINE IN DEXTROSE 0.75-8.25 % IT SOLN
INTRATHECAL | Status: DC | PRN
  Administered 2020-10-11: 12 mg via INTRATHECAL

## 2020-10-11 MED ORDER — ACETAMINOPHEN 160 MG/5ML PO SOLN: 325.0000 mg | ORAL | Status: DC | PRN

## 2020-10-11 MED ORDER — ACETAMINOPHEN 325 MG PO TABS: 325.0000 mg | ORAL_TABLET | ORAL | Status: DC | PRN

## 2020-10-11 MED ORDER — ACETAMINOPHEN 500 MG PO TABS
1000.0000 mg | ORAL_TABLET | Freq: Four times a day (QID) | ORAL | Status: DC
  Administered 2020-10-11 – 2020-10-14 (×11): 1000 mg via ORAL
  Filled 2020-10-11 (×11): qty 2

## 2020-10-11 MED ORDER — SODIUM CHLORIDE 0.9 % IR SOLN
Status: DC | PRN
  Administered 2020-10-11 (×3): 1

## 2020-10-11 MED ORDER — SOD CITRATE-CITRIC ACID 500-334 MG/5ML PO SOLN
30.0000 mL | ORAL | Status: AC
  Administered 2020-10-11: 30 mL via ORAL
  Filled 2020-10-11: qty 30

## 2020-10-11 MED ORDER — METHYLERGONOVINE MALEATE 0.2 MG/ML IJ SOLN
INTRAMUSCULAR | Status: AC
  Filled 2020-10-11: qty 1

## 2020-10-11 MED ORDER — IBUPROFEN 800 MG PO TABS
800.0000 mg | ORAL_TABLET | Freq: Three times a day (TID) | ORAL | Status: AC
  Administered 2020-10-11 – 2020-10-14 (×9): 800 mg via ORAL
  Filled 2020-10-11 (×9): qty 1

## 2020-10-11 MED ORDER — CEFAZOLIN SODIUM-DEXTROSE 2-4 GM/100ML-% IV SOLN
2.0000 g | INTRAVENOUS | Status: AC
  Administered 2020-10-11: 2 g via INTRAVENOUS
  Filled 2020-10-11: qty 100

## 2020-10-11 MED ORDER — DIBUCAINE (PERIANAL) 1 % EX OINT: 1.0000 "application " | TOPICAL_OINTMENT | CUTANEOUS | Status: DC | PRN

## 2020-10-11 MED ORDER — ACETAMINOPHEN 10 MG/ML IV SOLN
1000.0000 mg | Freq: Once | INTRAVENOUS | Status: AC
  Administered 2020-10-11: 1000 mg via INTRAVENOUS

## 2020-10-11 MED ORDER — ZOLPIDEM TARTRATE 5 MG PO TABS: 5.0000 mg | ORAL_TABLET | Freq: Every evening | ORAL | Status: DC | PRN

## 2020-10-11 MED ORDER — KETOROLAC TROMETHAMINE 30 MG/ML IJ SOLN
INTRAMUSCULAR | Status: AC
  Filled 2020-10-11: qty 1

## 2020-10-11 MED ORDER — ONDANSETRON HCL 4 MG/2ML IJ SOLN
INTRAMUSCULAR | Status: DC | PRN
  Administered 2020-10-11: 4 mg via INTRAVENOUS

## 2020-10-11 MED ORDER — FAMOTIDINE IN NACL 20-0.9 MG/50ML-% IV SOLN
INTRAVENOUS | Status: AC
  Filled 2020-10-11: qty 50

## 2020-10-11 MED ORDER — PHENYLEPHRINE HCL-NACL 20-0.9 MG/250ML-% IV SOLN
INTRAVENOUS | Status: AC
  Filled 2020-10-11: qty 250

## 2020-10-11 MED ORDER — MORPHINE SULFATE (PF) 0.5 MG/ML IJ SOLN
INTRAMUSCULAR | Status: AC
Start: 2020-10-11 — End: ?
  Filled 2020-10-11: qty 10

## 2020-10-11 MED ORDER — ONDANSETRON HCL 4 MG/2ML IJ SOLN
INTRAMUSCULAR | Status: AC
  Filled 2020-10-11: qty 2

## 2020-10-11 MED ORDER — MISOPROSTOL 200 MCG PO TABS: 1000.0000 ug | ORAL_TABLET | Freq: Once | ORAL | Status: DC

## 2020-10-11 MED ORDER — METHYLERGONOVINE MALEATE 0.2 MG PO TABS
0.2000 mg | ORAL_TABLET | ORAL | Status: AC
  Administered 2020-10-11 – 2020-10-12 (×6): 0.2 mg via ORAL
  Filled 2020-10-11 (×6): qty 1

## 2020-10-11 MED ORDER — METHYLERGONOVINE MALEATE 0.2 MG/ML IJ SOLN
0.2000 mg | INTRAMUSCULAR | Status: AC
  Filled 2020-10-11: qty 1

## 2020-10-11 MED ORDER — NALOXONE HCL 0.4 MG/ML IJ SOLN: 0.4000 mg | INTRAMUSCULAR | Status: DC | PRN

## 2020-10-11 MED ORDER — OXYTOCIN-SODIUM CHLORIDE 30-0.9 UT/500ML-% IV SOLN: 2.5000 [IU]/h | INTRAVENOUS | Status: AC

## 2020-10-11 SURGICAL SUPPLY — 41 items
APL SKNCLS STERI-STRIP NONHPOA (GAUZE/BANDAGES/DRESSINGS) ×1
BARRIER ADHS 3X4 INTERCEED (GAUZE/BANDAGES/DRESSINGS) ×2 IMPLANT
BENZOIN TINCTURE PRP APPL 2/3 (GAUZE/BANDAGES/DRESSINGS) ×3 IMPLANT
BRR ADH 4X3 ABS CNTRL BYND (GAUZE/BANDAGES/DRESSINGS) ×1
CHLORAPREP W/TINT 26ML (MISCELLANEOUS) ×3 IMPLANT
CLAMP CORD UMBIL (MISCELLANEOUS) IMPLANT
CLOSURE WOUND 1/2 X4 (GAUZE/BANDAGES/DRESSINGS) ×1
CLOTH BEACON ORANGE TIMEOUT ST (SAFETY) ×3 IMPLANT
DRAPE C SECTION CLR SCREEN (DRAPES) ×3 IMPLANT
DRSG OPSITE POSTOP 4X10 (GAUZE/BANDAGES/DRESSINGS) ×3 IMPLANT
ELECT REM PT RETURN 9FT ADLT (ELECTROSURGICAL) ×3
ELECTRODE REM PT RTRN 9FT ADLT (ELECTROSURGICAL) ×1 IMPLANT
EXTRACTOR VACUUM KIWI (MISCELLANEOUS) IMPLANT
GAUZE SPONGE 4X4 12PLY STRL LF (GAUZE/BANDAGES/DRESSINGS) ×4 IMPLANT
GLOVE BIOGEL M 7.0 STRL (GLOVE) ×3 IMPLANT
GLOVE BIOGEL PI IND STRL 7.0 (GLOVE) ×3 IMPLANT
GLOVE BIOGEL PI INDICATOR 7.0 (GLOVE) ×6
GOWN STRL REUS W/TWL LRG LVL3 (GOWN DISPOSABLE) ×9 IMPLANT
KIT ABG SYR 3ML LUER SLIP (SYRINGE) IMPLANT
NDL HYPO 25X5/8 SAFETYGLIDE (NEEDLE) IMPLANT
NEEDLE HYPO 25X5/8 SAFETYGLIDE (NEEDLE) IMPLANT
NS IRRIG 1000ML POUR BTL (IV SOLUTION) ×3 IMPLANT
PACK C SECTION WH (CUSTOM PROCEDURE TRAY) ×3 IMPLANT
PAD ABD 8X7 1/2 STERILE (GAUZE/BANDAGES/DRESSINGS) ×2 IMPLANT
PAD OB MATERNITY 4.3X12.25 (PERSONAL CARE ITEMS) ×3 IMPLANT
PENCIL SMOKE EVAC W/HOLSTER (ELECTROSURGICAL) ×3 IMPLANT
RTRCTR C-SECT PINK 25CM LRG (MISCELLANEOUS) IMPLANT
SPONGE LAP 18X18 X RAY DECT (DISPOSABLE) ×2 IMPLANT
STRIP CLOSURE SKIN 1/2X4 (GAUZE/BANDAGES/DRESSINGS) ×2 IMPLANT
SUT MNCRL 0 VIOLET CTX 36 (SUTURE) ×2 IMPLANT
SUT MONOCRYL 0 CTX 36 (SUTURE) ×6
SUT PDS AB 0 CTX 60 (SUTURE) ×3 IMPLANT
SUT PLAIN 0 NONE (SUTURE) IMPLANT
SUT VIC AB 0 CTX 36 (SUTURE) ×12
SUT VIC AB 0 CTX36XBRD ANBCTRL (SUTURE) IMPLANT
SUT VIC AB 2-0 CT1 27 (SUTURE)
SUT VIC AB 2-0 CT1 TAPERPNT 27 (SUTURE) IMPLANT
SUT VIC AB 4-0 KS 27 (SUTURE) ×3 IMPLANT
TOWEL OR 17X24 6PK STRL BLUE (TOWEL DISPOSABLE) ×3 IMPLANT
TRAY FOLEY W/BAG SLVR 14FR LF (SET/KITS/TRAYS/PACK) ×3 IMPLANT
WATER STERILE IRR 1000ML POUR (IV SOLUTION) ×5 IMPLANT

## 2020-10-11 SURGICAL SUPPLY — 23 items
BALLN POSTPARTUM SOS BAKRI (BALLOONS) ×3
BALLOON POSTPARTUM SOS BAKRI (BALLOONS) IMPLANT
CATH ROBINSON RED A/P 16FR (CATHETERS) ×3 IMPLANT
CLOTH BEACON ORANGE TIMEOUT ST (SAFETY) ×3 IMPLANT
DECANTER SPIKE VIAL GLASS SM (MISCELLANEOUS) ×3 IMPLANT
GLOVE BIOGEL M 6.5 STRL (GLOVE) ×6 IMPLANT
GLOVE BIOGEL PI IND STRL 6.5 (GLOVE) ×1 IMPLANT
GLOVE BIOGEL PI IND STRL 7.0 (GLOVE) ×1 IMPLANT
GLOVE BIOGEL PI INDICATOR 6.5 (GLOVE) ×2
GLOVE BIOGEL PI INDICATOR 7.0 (GLOVE) ×2
GOWN STRL REUS W/TWL LRG LVL3 (GOWN DISPOSABLE) ×6 IMPLANT
KIT BERKELEY 1ST TRIMESTER 3/8 (MISCELLANEOUS) ×3 IMPLANT
NS IRRIG 1000ML POUR BTL (IV SOLUTION) ×3 IMPLANT
PACK VAGINAL MINOR WOMEN LF (CUSTOM PROCEDURE TRAY) ×3 IMPLANT
PAD OB MATERNITY 4.3X12.25 (PERSONAL CARE ITEMS) ×3 IMPLANT
PAD PREP 24X48 CUFFED NSTRL (MISCELLANEOUS) ×3 IMPLANT
SET BERKELEY SUCTION TUBING (SUCTIONS) ×3 IMPLANT
SPONGE LAP 4X18 RFD (DISPOSABLE) ×2 IMPLANT
TOWEL OR 17X24 6PK STRL BLUE (TOWEL DISPOSABLE) ×6 IMPLANT
VACURETTE 10 RIGID CVD (CANNULA) IMPLANT
VACURETTE 7MM CVD STRL WRAP (CANNULA) IMPLANT
VACURETTE 8 RIGID CVD (CANNULA) IMPLANT
VACURETTE 9 RIGID CVD (CANNULA) IMPLANT

## 2020-10-11 NOTE — Transfer of Care (Signed)
Immediate Anesthesia Transfer of Care Note  Patient: Kimberlee Goetze  Procedure(s) Performed: DILATATION AND EVACUATION (N/A )  Patient Location: PACU  Anesthesia Type:Spinal  Level of Consciousness: awake, alert  and oriented  Airway & Oxygen Therapy: Patient Spontanous Breathing and Patient connected to nasal cannula oxygen  Post-op Assessment: Report given to RN and Post -op Vital signs reviewed and stable  Post vital signs: Reviewed and stable  Last Vitals:  Vitals Value Taken Time  BP 110/63 10/11/20 0833  Temp    Pulse 69 10/11/20 0836  Resp 18 10/11/20 0836  SpO2 100 % 10/11/20 0836  Vitals shown include unvalidated device data.  Last Pain:  Vitals:   10/11/20 0648  PainSc: 0-No pain         Complications: No complications documented.

## 2020-10-11 NOTE — Transfer of Care (Signed)
Immediate Anesthesia Transfer of Care Note  Patient: Penny Abbott  Procedure(s) Performed: REPEAT CESAREAN SECTION (N/A )  Patient Location: PACU  Anesthesia Type:Spinal  Level of Consciousness: awake and alert   Airway & Oxygen Therapy: Patient Spontanous Breathing  Post-op Assessment: Report given to RN  Post vital signs: Reviewed and stable  Last Vitals:  Vitals Value Taken Time  BP 108/58 10/11/20 0647  Temp    Pulse 69 10/11/20 0650  Resp 24 10/11/20 0650  SpO2 100 % 10/11/20 0650  Vitals shown include unvalidated device data.  Last Pain:  Vitals:   10/11/20 0351  PainSc: 7          Complications: No complications documented.

## 2020-10-11 NOTE — Op Note (Signed)
Cesarean Section Procedure Note  Indications: H/o cesarean section patient desires repeat and declines tolac  Pre-operative Diagnosis: 39 week 0 day pregnancy.  Post-operative Diagnosis: same  Surgeon: Gerald Leitz M.D.  Assistants: Ivonne Andrew   Anesthesia: Spinal anesthesia  ASA Class: 2   Procedure Details   The patient was seen in the Holding Room. The risks, benefits, complications, treatment options, and expected outcomes were discussed with the patient.  The patient concurred with the proposed plan, giving informed consent.  The site of surgery properly noted/marked. The patient was taken to Operating Room # B, identified as Penny Abbott and the procedure verified as C-Section Delivery. A Time Out was held and the above information confirmed.  After induction of anesthesia, the patient was draped and prepped in the usual sterile manner. A Pfannenstiel incision was made and carried down through the subcutaneous tissue to the fascia. Fascial incision was made and extended transversely. The fascia was separated from the underlying rectus tissue superiorly and inferiorly. The peritoneum was identified and entered. Peritoneal incision was extended longitudinally. The utero-vesical peritoneal reflection was incised transversely and the bladder flap was bluntly freed from the lower uterine segment. A low transverse uterine incision was made. Delivered from cephalic presentation was  Female with Apgar scores of 9 at one minute and 9 at five minutes. After the umbilical cord was clamped and cut cord blood was obtained for evaluation. The placenta was removed intact and appeared normal. The uterine outline, tubes and ovaries appeared normal. The uterine incision was closed with running locked sutures of 0 vicryl. A second layer of 0 vicryl was used to imbricate the incision . Methergine was injected into the fundus of the uterus in attempt to prevent uterine atony.  Hemostasis was observed.  Lavage was carried out until clear. Interseed was placed along the uterine incision. The fascia was then reapproximated with running sutures of 0 pds. The skin was reapproximated with 4-0 vicryl .  Instrument, sponge, and needle counts were correct prior the abdominal closure and at the conclusion of the case.   Findings: Female infant in the cephalic presentation / normal appearing fallopian tubes and ovaries.   Estimated Blood Loss:  900 mL         Drains: None         Total IV Fluids:  Per anesthesia ml         Specimens: Placenta to labor and delivery           Implants: None         Complications:  None; patient tolerated the procedure well.         Disposition: PACU - hemodynamically stable.         Condition: stable  Attending Attestation: I performed the procedure.

## 2020-10-11 NOTE — MAU Note (Signed)
.   Penny Abbott is a 29 y.o. at [redacted]w[redacted]d here in MAU reporting: Ctx that started at 0000. No VB or LOF. Endorses good fetal movement. Was supposed to have repeat c/s at 0700 but was moved to 1300 due to census. Ctx are now 10 minutes apart.   Pain score: 7 Vitals:   10/11/20 0348  BP: 121/82  Pulse: 84  Resp: 16  Temp: 98.6 F (37 C)  SpO2: 100%     FHT:144

## 2020-10-11 NOTE — Anesthesia Procedure Notes (Signed)
Spinal  Patient location during procedure: OB Start time: 10/11/2020 5:12 AM End time: 10/11/2020 5:20 AM Staffing Performed: anesthesiologist  Anesthesiologist: Trevor Iha, MD Preanesthetic Checklist Completed: patient identified, IV checked, risks and benefits discussed, surgical consent, monitors and equipment checked, pre-op evaluation and timeout performed Spinal Block Patient position: sitting Prep: DuraPrep and site prepped and draped Patient monitoring: heart rate, cardiac monitor, continuous pulse ox and blood pressure Approach: midline Location: L3-4 Injection technique: single-shot Needle Needle type: Pencan  Needle gauge: 24 G Needle length: 10 cm Needle insertion depth: 5 cm Assessment Sensory level: T4 Additional Notes 1 Attempt (s). Pt tolerated procedure well.

## 2020-10-11 NOTE — Anesthesia Preprocedure Evaluation (Signed)
Anesthesia Evaluation  Patient identified by MRN, date of birth, ID band Patient awake    Reviewed: Allergy & Precautions, NPO status , Patient's Chart, lab work & pertinent test results  Airway Mallampati: II  TM Distance: >3 FB Neck ROM: Full    Dental no notable dental hx. (+) Teeth Intact, Dental Advisory Given   Pulmonary neg pulmonary ROS,    Pulmonary exam normal breath sounds clear to auscultation       Cardiovascular Exercise Tolerance: Good Normal cardiovascular exam Rhythm:Regular Rate:Normal     Neuro/Psych negative neurological ROS     GI/Hepatic negative GI ROS, Neg liver ROS,   Endo/Other  negative endocrine ROS  Renal/GU negative Renal ROS     Musculoskeletal negative musculoskeletal ROS (+)   Abdominal   Peds  Hematology Lab Results      Component                Value               Date                      WBC                      12.3 (H)            10/09/2020                HGB                      12.7                10/09/2020                HCT                      38.0                10/09/2020                MCV                      94.3                10/09/2020                PLT                      152                 10/09/2020              Anesthesia Other Findings   Reproductive/Obstetrics (+) Pregnancy                             Anesthesia Physical  Anesthesia Plan  ASA: II and emergent  Anesthesia Plan: Spinal   Post-op Pain Management:    Induction:   PONV Risk Score and Plan: Treatment may vary due to age or medical condition and Ondansetron  Airway Management Planned: Nasal Cannula and Natural Airway  Additional Equipment: None  Intra-op Plan:   Post-operative Plan:   Informed Consent:     Dental advisory given  Plan Discussed with: Anesthesiologist and CRNA  Anesthesia Plan Comments:         Anesthesia Quick  Evaluation

## 2020-10-11 NOTE — Progress Notes (Signed)
RN attempted to ambulate pt out of bed to the bathroom. During orthostatic Bps, pt started to feel dizzy as she stood for the first standing bp. Pt vag bleeding increased. Pt felt like she was going to faint, so RN sat pt back in bed and reassessed bp after 5 mins. Rn will continue to monitor pt bp and vag bleeding and reassess in one hour.

## 2020-10-11 NOTE — Anesthesia Preprocedure Evaluation (Signed)
Anesthesia Evaluation  Patient identified by MRN, date of birth, ID band Patient awake    Reviewed: Allergy & Precautions, NPO status , Patient's Chart, lab work & pertinent test results  Airway Mallampati: II  TM Distance: >3 FB Neck ROM: Full    Dental no notable dental hx. (+) Teeth Intact, Dental Advisory Given   Pulmonary neg pulmonary ROS,    Pulmonary exam normal breath sounds clear to auscultation       Cardiovascular Exercise Tolerance: Good Normal cardiovascular exam Rhythm:Regular Rate:Normal     Neuro/Psych negative neurological ROS     GI/Hepatic negative GI ROS, Neg liver ROS,   Endo/Other  negative endocrine ROS  Renal/GU negative Renal ROS     Musculoskeletal negative musculoskeletal ROS (+)   Abdominal   Peds  Hematology Lab Results      Component                Value               Date                      WBC                      12.3 (H)            10/09/2020                HGB                      12.7                10/09/2020                HCT                      38.0                10/09/2020                MCV                      94.3                10/09/2020                PLT                      152                 10/09/2020              Anesthesia Other Findings   Reproductive/Obstetrics (+) Pregnancy                             Anesthesia Physical Anesthesia Plan  ASA: II  Anesthesia Plan: Spinal   Post-op Pain Management:    Induction:   PONV Risk Score and Plan: Treatment may vary due to age or medical condition and Ondansetron  Airway Management Planned: Nasal Cannula and Natural Airway  Additional Equipment: None  Intra-op Plan:   Post-operative Plan:   Informed Consent:     Dental advisory given  Plan Discussed with:   Anesthesia Plan Comments: (G2P1 for repeat C/S)        Anesthesia Quick Evaluation

## 2020-10-11 NOTE — Anesthesia Postprocedure Evaluation (Signed)
Anesthesia Post Note  Patient: Penny Abbott  Procedure(s) Performed: REPEAT CESAREAN SECTION (N/A )     Patient location during evaluation: Mother Baby Anesthesia Type: Spinal Level of consciousness: oriented and awake and alert Pain management: pain level controlled Vital Signs Assessment: post-procedure vital signs reviewed and stable Respiratory status: spontaneous breathing and respiratory function stable Cardiovascular status: blood pressure returned to baseline and stable Postop Assessment: no headache, no backache, no apparent nausea or vomiting and able to ambulate Anesthetic complications: no   No complications documented.  Last Vitals:  Vitals:   10/11/20 1316 10/11/20 1520  BP: (!) 98/43 108/63  Pulse: 71 63  Resp:  17  Temp: 36.7 C 36.9 C  SpO2: 99% 100%    Last Pain:  Vitals:   10/11/20 1630  TempSrc:   PainSc: 0-No pain   Pain Goal: Patients Stated Pain Goal: 3 (10/11/20 1630)                 Trevor Iha

## 2020-10-11 NOTE — Op Note (Signed)
10/11/2020  8:57 AM  PATIENT:  Penny Abbott  29 y.o. female  PRE-OPERATIVE DIAGNOSIS:  PPH D&E  POST-OPERATIVE DIAGNOSIS:  PPH D&E   PROCEDURE:  Procedure(s): DILATATION AND EVACUATION (N/A)  SURGEON:  Surgeon(s) and Role:    Gerald Leitz, MD - Primary    * Adam Phenix, MD - Assisting    * Levie Heritage, DO - Assisting  PHYSICIAN ASSISTANT:   ASSISTANTS: none   ANESTHESIA:   spinal  EBL:  Approximately 500 cc    BLOOD ADMINISTERED:none  DRAINS: Urinary Catheter (Foley)   LOCAL MEDICATIONS USED:  NONE  SPECIMEN:  No Specimen  DISPOSITION OF SPECIMEN:  N/A  COUNTS:  YES  TOURNIQUET:  * No tourniquets in log *  DICTATION: .Dragon Dictation  PLAN OF CARE: Admit to inpatient   PATIENT DISPOSITION:  PACU - hemodynamically stable.   Delay start of Pharmacological VTE agent (>24hrs) due to surgical blood loss or risk of bleeding: not applicable  Indication: POD #0 s/p cesarean section. Pt developed postpartum hemorrhage while in PACU with the lose of 500 cc of blood. She received hemabate IM, cyototec 1000 mcg per rectum, TXA 1 gram with continued Uterine Atony. Pt was consented for D&C under ultrasound guidance.   Procedure. Pt was taken to the Operating Room. Time out was performed. She was placed in the dorsal lithotomy position, prepped and draped in the normal sterile fashion. Speculum was placed. Abdominal ultrasound was performed.  Pt was noted to have blood in the lower uterine segment. The anterior lip of the cervix was grasped with a ring forcep.  Suction Curette was introduced and blood and any remaining products were removed. Attempt at [facility] balloon placement was performed. The balloon would not stay in the fundus as this portion of the uterus was not atonic. It was displaced to the lower uterine segment. And therefore removed. Pitocin 1 am was injected into the cervix and the lower uterine segment. The lower uterine segment atony resolved. All  instruments were removed from the vagina.   Sponge, lap and needle counts were correct x 2.

## 2020-10-11 NOTE — Anesthesia Postprocedure Evaluation (Signed)
Anesthesia Post Note  Patient: Penny Abbott  Procedure(s) Performed: DILATATION AND EVACUATION (N/A )     Patient location during evaluation: PACU Anesthesia Type: Spinal Level of consciousness: oriented and awake and alert Pain management: pain level controlled Vital Signs Assessment: post-procedure vital signs reviewed and stable Respiratory status: spontaneous breathing, respiratory function stable and patient connected to nasal cannula oxygen Cardiovascular status: blood pressure returned to baseline and stable Postop Assessment: no headache, no backache and no apparent nausea or vomiting Anesthetic complications: no   No complications documented.  Last Vitals:  Vitals:   10/11/20 0945 10/11/20 1001  BP: 125/67   Pulse: 78   Resp: 16   Temp:  37.6 C  SpO2: 99%     Last Pain:  Vitals:   10/11/20 1001  TempSrc: Oral  PainSc:    Pain Goal:                   Taleisha Kaczynski

## 2020-10-11 NOTE — Lactation Note (Signed)
This note was copied from a baby's chart. Lactation Consultation Note  Patient Name: Penny Abbott Today's Date: 10/11/2020 Reason for consult: Initial assessment;Mother's request;Term;Difficult latch Baby 8hrs old, called to room, mom reports difficulty latching baby to breast, reports exclusively pumped x25mo with first child who is now 30yo, reports baby latched fine in hospital but had difficulty latching after discharge. Mom's goal is to exclusively provide breast milk for at least 64mo, prefers to pump.  Assisted mom with latching baby to left and right breast in modified laid back, cross cradle, and football hold, LC held breast in baby's mouth but with difficulty maintaining latch. Mom fitted for 50mm nipple shield, baby latched and sucked with some improvement however no audible swallows noted, baby sliding on and off shield. Thick labial frenulum noted, cupping noted to tongue, peds in to see baby, evaluated lip and tongue to be ok, encouraged mom to continue to work on latch and will f/u tomorrow.  Discussed cue based feedings, 8-12 in 24hrs, typical newborn behavior in first 24hrs, hand express after every feeding/ attempt and offer colostrum back to baby via spoon, benefits of latching and pumping in hospital to obtain colostrum, recommended delay first bath until at least 1 good feeding achieved, avoid artificial nipples and pacifiers x48mo unless indicated, optimal skin to skin. Mom setup with DEBP, reviewed pump frequency and cleaning, offer EBM back to baby. If supplementation needed mom prefers her own EBM then [facility].   Plan - cue based feedings, wake if >3hrs since last feed - hand express and offer drops back to baby - pump after each feed or q3hrs  - offer DBM based on hrs old if no latch achieved or poor feed - call for [facility] support with new/ worsening feeding difficulties  Maternal Data Formula Feeding for Exclusion: No Has patient been taught Hand Expression?: Yes Does the  patient have breastfeeding experience prior to this delivery?: Yes  Feeding Feeding Type: Breast Fed  LATCH Score Latch: Repeated attempts needed to sustain latch, nipple held in mouth throughout feeding, stimulation needed to elicit sucking reflex.  Audible Swallowing: None  Type of Nipple: Everted at rest and after stimulation  Comfort (Breast/Nipple): Soft / non-tender  Hold (Positioning): Assistance needed to correctly position infant at breast and maintain latch.  LATCH Score: 6  Interventions Interventions: Breast feeding basics reviewed;Assisted with latch;Breast massage;Skin to skin;Hand express;Breast compression;Adjust position;Support pillows;Position options;Expressed milk;DEBP  Lactation Tools Discussed/Used WIC Program: No Pump Review: Setup, frequency, and cleaning Initiated by:: Erenest Rasher, RN, IBCLC Date initiated:: 10/11/20   Consult Status Consult Status: Follow-up Date: 10/12/20 Follow-up type: In-patient    Charlynn Court 10/11/2020, 2:04 PM

## 2020-10-11 NOTE — OR Nursing (Signed)
Pt arrived in [facility] at 0648 fundus firm but passing significant amount of blood and clots. Called Dr Richardson Dopp notified of blood loss. Dr Richardson Dopp on unit assessed pt. Cytotec , hemobate. txa given labs drawn . Pt continued to pass clots and bleed Dr Richardson Dopp consented pt to go back to OR.

## 2020-10-11 NOTE — Lactation Note (Signed)
This note was copied from a baby's chart. Lactation Consultation Note  Patient Name: Penny Abbott Today's Date: 10/11/2020  Mom requested assistance with breastfeeding. Baby Wisdom now 79 hours old. Assisted first in some hand expression and spoon/finger feeding.  Infant finally started sucking somewhat rhythmically on LC finger and tongue appears to go over infants gum line.  LC assisted with breastfeeding in modified laid back breastfeeding position on moms left breast.  Infant latched and came off and on at first but then was able to maintain latch and mom reported comfort. Some  Rhythmic sucking noted.  Infant fed on moms left breast and let go ( close to 15 minutes).  Nipple rounded on release. LC assist with latch on  Moms right breast.  After many attempts infant latched and breastfed a few more minutes on moms right breast..  Then let go and put STS with mom.  Nipple round on release. Urged mom to offer the breast on cues and at least 8-12 or more times day. Continue to pump and hand express past breastfeedings due too blood loss.  Call lactation as needed.   Maternal Data    Feeding Feeding Type: Breast Fed  LATCH Score                   Interventions    Lactation Tools Discussed/Used     Consult Status      Birdella Sippel Michaelle Copas 10/11/2020, 11:08 PM

## 2020-10-11 NOTE — H&P (Signed)
HPI: 29 y.o. y/o G2P1001 @ [redacted]w[redacted]d  estimated gestational age (as dated by LMP c/w 8 week ultrasound) is admitted for repeat cesarean section. She presented to [facility] complaining of contractions that are regular and cervix is 4 cm.      No Leaking of Fluid,   no Vaginal Bleeding,   no Uterine Contractions,  Good Fetal Movement.  Prenatal care has been provided by Dr. Myna Hidalgo (transition of care to Dr. Steva Ready)  ROS: Denies fevers, chills, chest pain, visual changes, SOB, RUQ/epigastric pain, N/V, dysuria, hematuria, or sudden onset/worsening bilateral LE or facial edema.  Pregnancy complicated by: History of postpartum hemorrhage requiring D&C and blood transfusion Leukocytosis (noted on CBCs obtained this pregnancy) - highest value WBC 15.6   Prenatal Transfer Tool  Maternal Diabetes: No Genetic Screening: Normal Maternal Ultrasounds/Referrals: Normal Fetal Ultrasounds or other Referrals:  None Maternal Substance Abuse:  No Significant Maternal Medications:  None Significant Maternal Lab Results: Group B Strep negative   PNL:  GBS neg, Rub Immune, Hep B neg, RPR NR, HIV neg, GC/C neg, glucola: 84 (wnl) H/H: 12.3/36.0 (09/28/20) Blood type: A Positive  Immunizations: Tdap: Given prenatally (08/11/20) Flu: Has not had  OBHx: G2P1001 G1:  [redacted]w[redacted]d, labor, pushed x 3 hours (complete/+2 station), required CS, postpartum hemorrhage occurred requiring D&C and blood transfusion PMHx:  None Meds:  PNV Allergy:  No Known Allergies SurgHx: Cesarean section SocHx:   Denies Tobacco, denies  EtOH, denies Illicit Drugs  O: LMP 01/11/2020  Gen. AAOx3, NAD CV.  RRR  No murmur.  Resp. CTAB, no wheeze or crackles. Abd. Gravid,  no tenderness,  no rigidity,  no guarding Extr.  no edema B/L , no calf tenderness, no Homan's B/L FHT:  Toco:  SVE: deferred   Placental location:  Posterior  Labs: see orders  A/P:  29 y.o. G2P1001 @ [redacted]w[redacted]d EGA who presents in active labor  for repeat cesarean section.  - Admit to L&D - Admit labs (CBC, T&S, COVID) - CEFM/Toco - Diet:  NPO - IVF:  Per routine - VTE Prophylaxis:  SCDs - GBS Status:  Negative - Antibiotics:  Ancef 2g on call to OR - Shohl's on call to OR - Consented for C/S and blood products  Consents: I have explained to the patient that this surgery is performed to deliver their baby or babies through an incision in the abdomen and incision in the uterus.  Prior to surgery, the risks and benefits of the surgery, as well as alternative treatments were discussed.  The risks include, but are not limited to, possible need for cesarean delivery for all future pregnancies, bleeding at the time of surgery that could necessitate a blood transfusion and/or hysterectomy, rupture of the uterus during a future pregnancy that could cause a preterm delivery and/or requiring hysterectomy, infection, damage to surrounding organs and tissues, damage to bladder, damage to ureters, causing kidney damage, and requiring additional procedures, damage to bowels, resulting in further surgery, postoperative pain, short-term and long-term, scarring on the abdominal wall and intra-abdominally, need for further surgery, development of an incisional hernia, deep vein thrombosis and/or pulmonary embolism, wound infection and/or separation, painful intercourse, urinary leakage, impact on future pregnancies including but not limited to, abnormal location or attachment of the placenta to the uterus, such as placenta previa or accreta, that may necessitate a blood transfusion and/or hysterectomy, impact on total family size, complications the course of which cannot be predicted or prevented, and death. Patient was consented for blood  products.  The patient is aware that bleeding may result in the need for a blood transfusion which includes risk of transmission of HIV (1:2 million), Hepatitis C (1:2 million), and Hepatitis B (1:200 thousand) and  transfusion reaction.  Patient voiced understanding of the above risks as well as understanding of indications for blood transfusion.

## 2020-10-12 LAB — CBC
HCT: 22.8 % — ABNORMAL LOW (ref 36.0–46.0)
Hemoglobin: 7.4 g/dL — ABNORMAL LOW (ref 12.0–15.0)
MCH: 31 pg (ref 26.0–34.0)
MCHC: 32.5 g/dL (ref 30.0–36.0)
MCV: 95.4 fL (ref 80.0–100.0)
Platelets: 115 10*3/uL — ABNORMAL LOW (ref 150–400)
RBC: 2.39 MIL/uL — ABNORMAL LOW (ref 3.87–5.11)
RDW: 14.9 % (ref 11.5–15.5)
WBC: 20.4 10*3/uL — ABNORMAL HIGH (ref 4.0–10.5)
nRBC: 0 % (ref 0.0–0.2)

## 2020-10-12 LAB — BIRTH TISSUE RECOVERY COLLECTION (PLACENTA DONATION)

## 2020-10-12 NOTE — Lactation Note (Signed)
This note was copied from a baby's chart. Lactation Consultation Note  Patient Name: Penny Abbott Today's Date: 10/12/2020 Reason for consult: Follow-up assessment;Term  P2 mother whose infant is now 31 hours old.  This is mother's first time breast feeding.  She pumped and bottle fed for 7 months with her first child (now 30 years old).  Mother is quite sure she would like to pump and bottle feed only with this baby also.  However, she is willing to latch while in the hospital.  Offered to assist with latching and mother agreeable.  Infant in father's arms dressed and sucking on a pacifier.  Discussed feeding STS and limiting use of a pacifier for at least 2-4 weeks if breast feeding.  Reasons given as to the benefits of not using a pacifier.  Mother verbalized understanding.  Assisted to latch in the cross cradle position on the right breast.  Initially, baby was fussy and would not latch.  Burped well and latched a second time. She fed for 5 minutes with constant gentle stimulation; does not appear to be very hungry at this time.  Breast feeding basics reviewed with parents.  Mother will continue to feed on cue or at least 8-12 times/24 hours.  She will call for latch assistance as needed.  Mother has a DEBP for home use.     Maternal Data    Feeding Feeding Type: Breast Fed  LATCH Score Latch: Repeated attempts needed to sustain latch, nipple held in mouth throughout feeding, stimulation needed to elicit sucking reflex.  Audible Swallowing: None  Type of Nipple: Everted at rest and after stimulation  Comfort (Breast/Nipple): Soft / non-tender  Hold (Positioning): Assistance needed to correctly position infant at breast and maintain latch.  LATCH Score: 6  Interventions Interventions: Breast feeding basics reviewed;Assisted with latch;Skin to skin;Hand express;Breast compression;Adjust position;DEBP;Hand pump;Position options;Support pillows  Lactation Tools  Discussed/Used     Consult Status Consult Status: Follow-up Date: 10/13/20 Follow-up type: In-patient    Penny Abbott R Penny Abbott 10/12/2020, 8:21 AM

## 2020-10-12 NOTE — Lactation Note (Signed)
This note was copied from a baby's chart. Lactation Consultation Note  Patient Name: Penny Abbott Today's Date: 10/12/2020 Reason for consult: Follow-up assessment   P2 mother whose infant is now 53 hours old.  This is mother's first time breast feeding.  She pumped and bottle fed for 7 months with her first child (now 30 years old).  Mother is planning on pumping and bottle feeding again, but will latch while in the hospital.  RN requested latch assistance.  Baby was swaddled and asleep next to mother when I arrived.  Baby has not latched and fed well today.  Baby has only had colostrum drops since delivery.  Offered to assist with waking baby and latching.  Mother agreeable.    Reviewed concepts discussed this morning including STS, how to awaken a sleepy baby, hand expression and latching techniques.  Demonstrated gentle stimulation and rubbing up and down her back to help awaken her.  She began to awaken.  Initiated a rhythmic suck on my gloved finger and assisted to latch in the football hold on the left breast easily.  Baby willingly opened wide with flanged lips and latched well.  However, once at the breast, she fell asleep.  Gentle stimulation and breast compressions did not encourage her to suck.  She became fussy and pushed back when I stimulated her.  Attempted a second time with the same results.  Suggested mother pump and feed back any EBM she obtains immediately to baby.  Mother has not been pumping consistently.  Again, re-educated mother on the importance of pumping at least every three hours or immediately after breast feeding.  If mother does not get adequate volume she will supplement baby.  Asked her to call her RN after pumping for supplementation.  Mother verbalized understanding.  RN updated and will follow through.   Maternal Data    Feeding    LATCH Score Latch: Repeated attempts needed to sustain latch, nipple held in mouth throughout feeding, stimulation  needed to elicit sucking reflex.  Audible Swallowing: None  Type of Nipple: Everted at rest and after stimulation  Comfort (Breast/Nipple): Soft / non-tender  Hold (Positioning): Assistance needed to correctly position infant at breast and maintain latch.  LATCH Score: 6  Interventions Interventions: Breast feeding basics reviewed;Assisted with latch;Skin to skin;Breast massage;Hand express;Breast compression;Adjust position;DEBP;Hand pump;Position options;Support pillows  Lactation Tools Discussed/Used     Consult Status Consult Status: Follow-up Date: 10/13/20 Follow-up type: In-patient    Dora Sims 10/12/2020, 5:23 PM

## 2020-10-12 NOTE — Progress Notes (Signed)
Subjective: Postpartum Day 1: Cesarean Delivery Patient reports incisional pain, tolerating PO, + flatus and no problems voiding. Patient compliaing of gas pain    Objective: Vital signs in last 24 hours: Temp:  [98 F (36.7 C)-98.8 F (37.1 C)] 98 F (36.7 C) (10/13 1633) Pulse Rate:  [62-79] 79 (10/13 1633) Resp:  [15-18] 18 (10/13 1633) BP: (89-115)/(46-54) 97/54 (10/13 1633) SpO2:  [99 %-100 %] 100 % (10/13 1633)  Physical Exam:  General: alert, cooperative and no distress Lochia: appropriate Uterine Fundus: firm Incision: bandage clean dry and intact  DVT Evaluation: No evidence of DVT seen on physical exam.  Recent Labs    10/11/20 0712 10/12/20 0547  HGB 12.4 7.4*  HCT 38.2 22.8*    Assessment/Plan: Status post Cesarean section. Postoperative course complicated by pph pt is doing well s/p D&C   Gas pain - simethicone prn  Encourage ambulation Anemia- pt is asymptomatic VSS .  Gerald Leitz 10/12/2020, 6:46 PM

## 2020-10-13 LAB — BPAM RBC
Blood Product Expiration Date: 202111042359
Blood Product Expiration Date: 202111042359
Unit Type and Rh: 6200
Unit Type and Rh: 6200

## 2020-10-13 LAB — TYPE AND SCREEN
ABO/RH(D): A POS
Antibody Screen: NEGATIVE
Unit division: 0
Unit division: 0

## 2020-10-13 NOTE — Lactation Note (Signed)
This note was copied from a baby's chart. Lactation Consultation Note  Patient Name: Penny Abbott Today's Date: 10/13/2020  P2, 59 hours term female infant with -7% weight loss. Per mom, she plans to pump only and formula feed infant for 6 months she pumped only with her son for 7 months. She has no barriers with pumping at work she can pump at any time. Per mom, she recently pumped 15.5 mls of colostrum less than 1 hour ago, current plan she has been giving infant her EBM first and then formula afterwards. Infant's last feeding infant received 15 mls of colostrum and 21 mls of formula within the feeding. Mom knows to feed infant according to cues, 8 to 12+ times within 24 hours. LC did discuss hands on pumping to help maintain milk supply with using DEBP first and afterwards hand expression. Mom showed LC her Medela DEBP from home.  Mom appeared appreciative of information given by LC. LC did discuss with mom milk storage and collection guidelines using the " Understanding Postpartum Health & Baby Care - A Guide to the First Days & Weeks".  LC discussed if mom has any questions or concerns please call LC services.   Maternal Data    Feeding Feeding Type: Bottle Fed - Breast Milk  LATCH Score                   Interventions    Lactation Tools Discussed/Used     Consult Status      Penny Abbott 10/13/2020, 5:30 PM

## 2020-10-13 NOTE — Progress Notes (Signed)
Subjective: Postpartum Day 1: Cesarean Delivery Patient reports incisional pain, tolerating PO, + flatus and no problems voiding. Patient compliaing of gas pain, minimal improvement with medication.  Pt has not ambulated in the hallways.  Will walk with husband when he returns.  Denies dizziness or lightheadedness.  Pt reports baby is not staying latched.  Considering bottle feeding. Pt reports heavy menses and h/o PPH with last pregnancy.  Objective: Vital signs in last 24 hours: Temp:  [98 F (36.7 C)-98.6 F (37 C)] 98.2 F (36.8 C) (10/14 0552) Pulse Rate:  [79-102] 92 (10/14 0552) Resp:  [18-20] 18 (10/14 0521) BP: (86-104)/(45-59) 101/59 (10/14 0552) SpO2:  [100 %] 100 % (10/14 0552)  Physical Exam:  General: alert, cooperative and no distress Lochia: appropriate Uterine Fundus: firm Incision: bandage clean dry and intact  DVT Evaluation: No evidence of DVT seen on physical exam.  Edema present.  Recent Labs    10/11/20 0712 10/12/20 0547  HGB 12.4 7.4*  HCT 38.2 22.8*    Assessment/Plan: Status post Cesarean section. Postoperative course complicated by pph pt is doing well s/p D&C   Gas pain - simethicone prn, Encouraged ambulation in halls TID. Anemia- pt is asymptomatic VSS PPH x 2 and menorrhagia.  Consider hematology evaluation outpatient. Anticipate discharge tomorrow. Lactation support. Geryl Rankins 10/13/2020, 1:41 PM

## 2020-10-14 MED ORDER — OXYCODONE HCL 5 MG PO TABS
5.0000 mg | ORAL_TABLET | ORAL | 0 refills | Status: DC | PRN
Start: 2020-10-14 — End: 2021-01-03

## 2020-10-14 MED ORDER — ACETAMINOPHEN 500 MG PO TABS: 1000.0000 mg | ORAL_TABLET | Freq: Four times a day (QID) | ORAL | 0 refills | Status: AC

## 2020-10-14 MED ORDER — POLYSACCHARIDE IRON COMPLEX 150 MG PO CAPS: 150.0000 mg | ORAL_CAPSULE | Freq: Every day | ORAL | 2 refills | Status: AC

## 2020-10-14 MED ORDER — POLYSACCHARIDE IRON COMPLEX 150 MG PO CAPS
150.0000 mg | ORAL_CAPSULE | Freq: Every day | ORAL | Status: DC
  Administered 2020-10-14: 150 mg via ORAL
  Filled 2020-10-14: qty 1

## 2020-10-14 NOTE — Discharge Summary (Signed)
Postpartum Discharge Summary       Patient Name: Penny Abbott DOB: Jan 06, 1991 MRN: [id number]  Date of admission: 10/11/2020 Delivery date:10/11/2020  Delivering provider: Christophe Louis  Date of discharge: 10/14/2020  Admitting diagnosis: H/O cesarean section complicating pregnancy [E10.071] History of cesarean delivery [Z98.891] Intrauterine pregnancy: [redacted]w[redacted]d    Secondary diagnosis:  Active Problems:   H/O cesarean section complicating pregnancy   History of cesarean delivery  Additional problems: Postpartum hemorrhage    Discharge diagnosis: Term Pregnancy Delivered, Anemia and PPH                                              Post partum procedures:Iron supplementation Augmentation: N/A Complications: [id number] Hospital course: Sceduled C/S   29y.o. yo G2P2002 at 33w1das admitted to the hospital 10/11/2020 for scheduled cesarean section with the following indication:Elective Repeat.Delivery details are as follows:  Membrane Rupture Time/Date: 5:46 AM ,10/11/2020   Delivery Method:C-Section, Low Transverse  Details of operation can be found in separate operative note.  Patient had an uncomplicated postpartum course.  She is ambulating, tolerating a regular diet, passing flatus, and urinating well. Patient is discharged home in stable condition on  10/14/20        Newborn Data: Birth date:10/11/2020  Birth time:5:47 AM  Gender:Female  Living status:Living  Apgars:9 ,9  Weight:3581 g     Magnesium Sulfate received: No BMZ received: No Rhophylac:N/A MMR:No T-DaP:See prenatal chart Flu: No Transfusion:No  Physical exam  Vitals:   10/13/20 0552 10/13/20 1435 10/13/20 2029 10/14/20 0445  BP: (!) 101/59 98/62 113/63 119/74  Pulse: 92 77 82 79  Resp:  17 16 18   Temp: 98.2 F (36.8 C) 98.3 F (36.8 C) 98.9 F (37.2 C) 98.9 F (37.2 C)  TempSrc: Oral Oral Oral Oral  SpO2: 100%  99% 100%  Weight:      Height:       General: alert, cooperative and  no distress Lochia: appropriate Uterine Fundus: firm Incision: Minimally soiled honeycomb DVT Evaluation: No evidence of DVT seen on physical exam. Calf/Ankle edema is present Labs: Lab Results  Component Value Date   WBC 20.4 (H) 10/12/2020   HGB 7.4 (L) 10/12/2020   HCT 22.8 (L) 10/12/2020   MCV 95.4 10/12/2020   PLT 115 (L) 10/12/2020   CMP Latest Ref Rng & Units 11/23/2014  Glucose 70 - 99 mg/dL 91  BUN 6 - 23 mg/dL 8  Creatinine 0.50 - 1.10 mg/dL 0.53  Sodium 137 - 147 mEq/L 136(L)  Potassium 3.7 - 5.3 mEq/L 4.0  Chloride 96 - 112 mEq/L 101  CO2 19 - 32 mEq/L 22  Calcium 8.4 - 10.5 mg/dL 9.2  Total Protein 6.0 - 8.3 g/dL 6.3  Total Bilirubin 0.3 - 1.2 mg/dL 0.2(L)  Alkaline Phos 39 - 117 U/L 130(H)  AST 0 - 37 U/L 17  ALT 0 - 35 U/L 13   Edinburgh Score: Edinburgh Postnatal Depression Scale Screening Tool 10/12/2020  I have been able to laugh and see the funny side of things. 0  I have looked forward with enjoyment to things. 0  I have blamed myself unnecessarily when things went wrong. 0  I have been anxious or worried for no good reason. 0  I have felt scared or panicky for no good reason. 1  Things have been getting on  top of me. 0  I have been so unhappy that I have had difficulty sleeping. 0  I have felt sad or miserable. 0  I have been so unhappy that I have been crying. 0  The thought of harming myself has occurred to me. 0  Edinburgh Postnatal Depression Scale Total 1      After visit meds:  Allergies as of 10/14/2020   No Known Allergies     Medication List    TAKE these medications   acetaminophen 500 MG tablet Commonly known as: TYLENOL Take 2 tablets (1,000 mg total) by mouth every 6 (six) hours.   ferrous sulfate 325 (65 FE) MG tablet Take 1 tablet (325 mg total) by mouth 2 (two) times daily with a meal.   iron polysaccharides 150 MG capsule Commonly known as: NIFEREX Take 1 capsule (150 mg total) by mouth daily.   oxyCODONE 5 MG  immediate release tablet Commonly known as: Oxy IR/ROXICODONE Take 1 tablet (5 mg total) by mouth every 4 (four) hours as needed for moderate pain, severe pain or breakthrough pain.   prenatal multivitamin Tabs tablet Take 1 tablet by mouth daily at 12 noon.        Discharge home in stable condition Infant Feeding: Breast Infant Disposition:home with mother Discharge instruction: per After Visit Summary and Postpartum booklet. Activity: Advance as tolerated. Pelvic rest for 6 weeks.  Diet: routine diet Anticipated Birth Control: Condoms Postpartum Appointment:2 weeks Additional Postpartum F/U: Incision check 2 weeks Future Appointments:No future appointments. Follow up Visit:  Follow-up Information    Drema Dallas, DO. Schedule an appointment as soon as possible for a visit in 2 week(s).   Specialty: Obstetrics and Gynecology Why: Post op follow up Contact information: [address] [place] [address] [phone]                   10/14/2020 Thurnell Lose, MD

## 2020-10-14 NOTE — Discharge Instructions (Signed)
Cesarean Delivery, Care After This sheet gives you information about how to care for yourself after your procedure. Your health care provider may also give you more specific instructions. If you have problems or questions, contact your health care provider. What can I expect after the procedure? After the procedure, it is common to have:  A small amount of blood or clear fluid coming from the incision.  Some redness, swelling, and pain in your incision area.  Some abdominal pain and soreness.  Vaginal bleeding (lochia). Even though you did not have a vaginal delivery, you will still have vaginal bleeding and discharge.  Pelvic cramps.  Fatigue. You may have pain, swelling, and discomfort in the tissue between your vagina and your anus (perineum) if:  Your C-section was unplanned, and you were allowed to labor and push.  An incision was made in the area (episiotomy) or the tissue tore during attempted vaginal delivery. Follow these instructions at home: Incision care   Follow instructions from your health care provider about how to take care of your incision. Make sure you: ? Wash your hands with soap and water before you change your bandage (dressing). If soap and water are not available, use hand sanitizer. ? If you have a dressing, change it or remove it as told by your health care provider. ? Leave stitches (sutures), skin staples, skin glue, or adhesive strips in place. These skin closures may need to stay in place for 2 weeks or longer. If adhesive strip edges start to loosen and curl up, you may trim the loose edges. Do not remove adhesive strips completely unless your health care provider tells you to do that.  Check your incision area every day for signs of infection. Check for: ? More redness, swelling, or pain. ? More fluid or blood. ? Warmth. ? Pus or a bad smell.  Do not take baths, swim, or use a hot tub until your health care provider says it's okay. Ask your health  care provider if you can take showers.  When you cough or sneeze, hug a pillow. This helps with pain and decreases the chance of your incision opening up (dehiscing). Do this until your incision heals. Medicines  Take over-the-counter and prescription medicines only as told by your health care provider.  If you were prescribed an antibiotic medicine, take it as told by your health care provider. Do not stop taking the antibiotic even if you start to feel better.  Do not drive or use heavy machinery while taking prescription pain medicine. Lifestyle  Do not drink alcohol. This is especially important if you are breastfeeding or taking pain medicine.  Do not use any products that contain nicotine or tobacco, such as cigarettes, e-cigarettes, and chewing tobacco. If you need help quitting, ask your health care provider. Eating and drinking  Drink at least 8 eight-ounce glasses of water every day unless told not to by your health care provider. If you breastfeed, you may need to drink even more water.  Eat high-fiber foods every day. These foods may help prevent or relieve constipation. High-fiber foods include: ? Whole grain cereals and breads. ? Brown rice. ? Beans. ? Fresh fruits and vegetables. Activity   If possible, have someone help you care for your baby and help with household activities for at least a few days after you leave the hospital.  Return to your normal activities as told by your health care provider. Ask your health care provider what activities are safe for   you.  Rest as much as possible. Try to rest or take a nap while your baby is sleeping.  Do not lift anything that is heavier than 10 lbs (4.5 kg), or the limit that you were told, until your health care provider says that it is safe.  Talk with your health care provider about when you can engage in sexual activity. This may depend on your: ? Risk of infection. ? How fast you heal. ? Comfort and desire to  engage in sexual activity. General instructions  Do not use tampons or douches until your health care provider approves.  Wear loose, comfortable clothing and a supportive and well-fitting bra.  Keep your perineum clean and dry. Wipe from front to back when you use the toilet.  If you pass a blood clot, save it and call your health care provider to discuss. Do not flush blood clots down the toilet before you get instructions from your health care provider.  Keep all follow-up visits for you and your baby as told by your health care provider. This is important. Contact a health care provider if:  You have: ? A fever. ? Bad-smelling vaginal discharge. ? Pus or a bad smell coming from your incision. ? Difficulty or pain when urinating. ? A sudden increase or decrease in the frequency of your bowel movements. ? More redness, swelling, or pain around your incision. ? More fluid or blood coming from your incision. ? A rash. ? Nausea. ? Little or no interest in activities you used to enjoy. ? Questions about caring for yourself or your baby.  Your incision feels warm to the touch.  Your breasts turn red or become painful or hard.  You feel unusually sad or worried.  You vomit.  You pass a blood clot from your vagina.  You urinate more than usual.  You are dizzy or light-headed. Get help right away if:  You have: ? Pain that does not go away or get better with medicine. ? Chest pain. ? Difficulty breathing. ? Blurred vision or spots in your vision. ? Thoughts about hurting yourself or your baby. ? New pain in your abdomen or in one of your legs. ? A severe headache.  You faint.  You bleed from your vagina so much that you fill more than one sanitary pad in one hour. Bleeding should not be heavier than your heaviest period. Summary  After the procedure, it is common to have pain at your incision site, abdominal cramping, and slight bleeding from your vagina.  Check  your incision area every day for signs of infection.  Tell your health care provider about any unusual symptoms.  Keep all follow-up visits for you and your baby as told by your health care provider. This information is not intended to replace advice given to you by your health care provider. Make sure you discuss any questions you have with your health care provider. Document Revised: 06/25/2018 Document Reviewed: 06/25/2018 Elsevier Patient Education  2020 [employer]  

## 2020-10-22 DIAGNOSIS — N390 Urinary tract infection, site not specified: Secondary | ICD-10-CM | POA: Diagnosis not present

## 2020-10-22 DIAGNOSIS — R300 Dysuria: Secondary | ICD-10-CM | POA: Diagnosis not present

## 2020-10-23 ENCOUNTER — Encounter (HOSPITAL_COMMUNITY): Payer: Self-pay | Admitting: Obstetrics and Gynecology

## 2020-10-23 ENCOUNTER — Other Ambulatory Visit: Payer: Self-pay

## 2020-10-23 ENCOUNTER — Inpatient Hospital Stay (HOSPITAL_COMMUNITY)
Admission: AD | Admit: 2020-10-23 | Discharge: 2020-10-25 | DRG: 776 | Disposition: A | Discharge status: Home / Self Care | Payer: BC Managed Care – PPO | Attending: Obstetrics and Gynecology | Admitting: Obstetrics and Gynecology

## 2020-10-23 DIAGNOSIS — N719 Inflammatory disease of uterus, unspecified: Secondary | ICD-10-CM

## 2020-10-23 DIAGNOSIS — O8612 Endometritis following delivery: Principal | ICD-10-CM | POA: Diagnosis present

## 2020-10-23 DIAGNOSIS — Z20822 Contact with and (suspected) exposure to covid-19: Secondary | ICD-10-CM | POA: Diagnosis not present

## 2020-10-23 DIAGNOSIS — O864 Pyrexia of unknown origin following delivery: Secondary | ICD-10-CM | POA: Diagnosis not present

## 2020-10-23 DIAGNOSIS — O135 Gestational [pregnancy-induced] hypertension without significant proteinuria, complicating the puerperium: Secondary | ICD-10-CM | POA: Diagnosis present

## 2020-10-23 DIAGNOSIS — T8140XA Infection following a procedure, unspecified, initial encounter: Secondary | ICD-10-CM | POA: Diagnosis present

## 2020-10-23 DIAGNOSIS — O99893 Other specified diseases and conditions complicating puerperium: Secondary | ICD-10-CM | POA: Diagnosis not present

## 2020-10-23 DIAGNOSIS — O99891 Other specified diseases and conditions complicating pregnancy: Secondary | ICD-10-CM | POA: Diagnosis not present

## 2020-10-23 DIAGNOSIS — Z3A39 39 weeks gestation of pregnancy: Secondary | ICD-10-CM | POA: Diagnosis not present

## 2020-10-23 DIAGNOSIS — R001 Bradycardia, unspecified: Secondary | ICD-10-CM | POA: Diagnosis present

## 2020-10-23 HISTORY — DX: Infection following a procedure, unspecified, initial encounter: T81.40XA

## 2020-10-23 LAB — TYPE AND SCREEN
ABO/RH(D): A POS
Antibody Screen: NEGATIVE

## 2020-10-23 LAB — URINALYSIS, ROUTINE W REFLEX MICROSCOPIC
Bilirubin Urine: NEGATIVE
Glucose, UA: NEGATIVE mg/dL
Ketones, ur: NEGATIVE mg/dL
Nitrite: NEGATIVE
Protein, ur: NEGATIVE mg/dL
Specific Gravity, Urine: 1.016 (ref 1.005–1.030)
WBC, UA: >50 WBC/hpf — ABNORMAL HIGH (ref 0–5)
pH: 8 (ref 5.0–8.0)

## 2020-10-23 LAB — CBC WITH DIFFERENTIAL/PLATELET
Abs Immature Granulocytes: 0.16 10*3/uL — ABNORMAL HIGH (ref 0.00–0.07)
Basophils Absolute: 0 10*3/uL (ref 0.0–0.1)
Basophils Relative: 0 %
Eosinophils Absolute: 0 10*3/uL (ref 0.0–0.5)
Eosinophils Relative: 0 %
HCT: 26.7 % — ABNORMAL LOW (ref 36.0–46.0)
Hemoglobin: 8.2 g/dL — ABNORMAL LOW (ref 12.0–15.0)
Immature Granulocytes: 1 %
Lymphocytes Relative: 9 %
Lymphs Abs: 1.5 10*3/uL (ref 0.7–4.0)
MCH: 30.7 pg (ref 26.0–34.0)
MCHC: 30.7 g/dL (ref 30.0–36.0)
MCV: 100 fL (ref 80.0–100.0)
Monocytes Absolute: 0.9 10*3/uL (ref 0.1–1.0)
Monocytes Relative: 5 %
Neutro Abs: 14.9 10*3/uL — ABNORMAL HIGH (ref 1.7–7.7)
Neutrophils Relative %: 85 %
Platelets: 339 10*3/uL (ref 150–400)
RBC: 2.67 MIL/uL — ABNORMAL LOW (ref 3.87–5.11)
RDW: 17.3 % — ABNORMAL HIGH (ref 11.5–15.5)
WBC: 17.5 10*3/uL — ABNORMAL HIGH (ref 4.0–10.5)
nRBC: 0.3 % — ABNORMAL HIGH (ref 0.0–0.2)

## 2020-10-23 LAB — RESPIRATORY PANEL BY RT PCR (FLU A&B, COVID)
Influenza A by PCR: NEGATIVE
Influenza B by PCR: NEGATIVE
SARS Coronavirus 2 by RT PCR: NEGATIVE

## 2020-10-23 MED ORDER — PRENATAL MULTIVITAMIN CH
1.0000 | ORAL_TABLET | Freq: Every day | ORAL | Status: DC
  Administered 2020-10-24 – 2020-10-25 (×2): 1 via ORAL
  Filled 2020-10-23 (×2): qty 1

## 2020-10-23 MED ORDER — OXYCODONE-ACETAMINOPHEN 5-325 MG PO TABS: 1.0000 | ORAL_TABLET | Freq: Four times a day (QID) | ORAL | Status: DC | PRN

## 2020-10-23 MED ORDER — CALCIUM CARBONATE ANTACID 500 MG PO CHEW: 2.0000 | CHEWABLE_TABLET | ORAL | Status: DC | PRN

## 2020-10-23 MED ORDER — ZOLPIDEM TARTRATE 5 MG PO TABS: 5.0000 mg | ORAL_TABLET | Freq: Every evening | ORAL | Status: DC | PRN

## 2020-10-23 MED ORDER — ACETAMINOPHEN 500 MG PO TABS
1000.0000 mg | ORAL_TABLET | Freq: Once | ORAL | Status: AC
  Administered 2020-10-23: 1000 mg via ORAL
  Filled 2020-10-23: qty 2

## 2020-10-23 MED ORDER — SODIUM CHLORIDE 0.9 % IV SOLN
2.0000 g | Freq: Four times a day (QID) | INTRAVENOUS | Status: DC
  Administered 2020-10-23 – 2020-10-24 (×2): 2 g via INTRAVENOUS
  Filled 2020-10-23 (×2): qty 2000

## 2020-10-23 MED ORDER — IBUPROFEN 800 MG PO TABS
800.0000 mg | ORAL_TABLET | Freq: Three times a day (TID) | ORAL | Status: DC
  Administered 2020-10-23 – 2020-10-25 (×6): 800 mg via ORAL
  Filled 2020-10-23 (×6): qty 1

## 2020-10-23 MED ORDER — LACTATED RINGERS IV SOLN
INTRAVENOUS | Status: DC
  Administered 2020-10-23: 1000 mL via INTRAVENOUS

## 2020-10-23 MED ORDER — DOCUSATE SODIUM 100 MG PO CAPS
100.0000 mg | ORAL_CAPSULE | Freq: Every day | ORAL | Status: DC
  Administered 2020-10-24: 100 mg via ORAL
  Filled 2020-10-23: qty 1

## 2020-10-23 MED ORDER — GENTAMICIN SULFATE 40 MG/ML IJ SOLN
5.0000 mg/kg | INTRAVENOUS | Status: DC
  Administered 2020-10-23 – 2020-10-24 (×2): 350 mg via INTRAVENOUS
  Filled 2020-10-23 (×3): qty 8.75

## 2020-10-23 MED ORDER — ACETAMINOPHEN 500 MG PO TABS
1000.0000 mg | ORAL_TABLET | Freq: Three times a day (TID) | ORAL | Status: DC
  Administered 2020-10-23 – 2020-10-25 (×6): 1000 mg via ORAL
  Filled 2020-10-23 (×6): qty 2

## 2020-10-23 NOTE — MAU Note (Signed)
  Assessment completed-uterine tenderness with movement and palpation-pt reports foul smelling lochia with increased foul odor since Wednesday-steri strips remain intact from pt CS no evidence of drainage or hematoma at incision-bilaterall pedal and pretibial edema noted with edema in L leg more prominent. + pedal pulses. - homans sign

## 2020-10-23 NOTE — MAU Note (Signed)
Penny Abbott is a 29 y.o. here in MAU reporting: s/p c/s on 10/12. Since Wednesday noticed foul smelling lochia. Went to [facility] walk in clinic yesterday to give a urine sample and was told she has an infection and was given antibiotics. Has been having chills. Took temperature at home around 1500 and it was 100.8.  Onset of complaint: ongoing  Pain score: 7/10  Vitals:   10/23/20 1710  BP: (!) 146/82  Pulse: 98  Resp: 18  Temp: (!) 102.9 F (39.4 C)  SpO2: 100%     Lab orders placed from triage: none

## 2020-10-23 NOTE — MAU Note (Signed)
Pt reports she is feeling better. Temp decreasing - IV LR infusing. Blood cultures drawn and sent - Antibiotics infusing

## 2020-10-23 NOTE — H&P (Addendum)
OB ADMISSION/ HISTORY & PHYSICAL:  Admission Date: 10/23/2020  4:27 PM  Admit Diagnosis: Possible endometritis  Penny Abbott is a 29 y.o. female G2P2002 presenting for chills, fever and abd pain. S/P repeat C/S 10/11/20 w/ PPH EBL of 2296 mL, followed by D&C. Pain and fever began today, foul smelling vaginal discharge began 10/19/20. Pt denies anything in vagina since delivery. Pt was seen at [facility] walk in clinic and dx w/ "infection", Rx Keflex 500 mg TID, first dose 10/23/20 @ 1130. Pt called service this AM w/ c/o chills and pain and was advised to come to the hospital. Pt called again this afternoon w/ c/o chills and resolution of pain since starting Keflex. Pt again advised to come to hospital for assessment. Exclusively pumping, denies changes in breast and denies changes in supply.   OB History: [id number]   Primary OB Provider: Charlotta Newton and then tx to [facility]  Patient Active Problem List   Diagnosis Date Noted  . H/O cesarean section complicating pregnancy 10/11/2020  . History of cesarean delivery 10/11/2020  . Shortness of breath 02/03/2019  . Hair loss 02/03/2019  . Tachycardia 02/03/2019  . Vegetarian diet 11/10/2018  . Iron deficiency anemia secondary to inadequate dietary iron intake 11/10/2018  . Fatigue 11/10/2018  . Status post primary low transverse cesarean section 11/26/2014  . Pregnancy 11/22/2014    Prenatal Labs: ABO, Rh: --/--/A POS (10/10 3817) Antibody: NEG (10/10 0838) Rubella: Immune (03/11 0000)  RPR: NON REACTIVE (10/10 0838)  HBsAg: Negative (03/11 0000)  HIV: Non-reactive (03/11 0000)     OB History  Gravida Para Term Preterm AB Living  2 2 2     2   SAB TAB Ectopic Multiple Live Births        0 2    # Outcome Date GA Lbr Len/2nd Weight Sex Delivery Anes PTL Lv  2 Term 10/11/20 [redacted]w[redacted]d  3581 g F CS-LTranv Spinal  LIV  1 Term 11/23/14 [redacted]w[redacted]d 04:28 / 04:40 3881 g M CS-LTranv EPI  LIV     Birth Comments: Extra digit off pinkie bilaterally     Medical  / Surgical History: Past medical history:  Past Medical History:  Diagnosis Date  . Blood transfusion without reported diagnosis   . History of postpartum hemorrhage, currently pregnant   . Iron (Fe) deficiency anemia   . Medical history non-contributory     Past surgical history:  Past Surgical History:  Procedure Laterality Date  . ACHILLES TENDON REPAIR    . CESAREAN SECTION N/A 11/23/2014   Procedure: CESAREAN SECTION;  Surgeon: 11/25/2014. Dorien Chihuahua, MD;  Location: [facility];  Service: Obstetrics;  Laterality: N/A;  . CESAREAN SECTION N/A 10/11/2020   Procedure: REPEAT CESAREAN SECTION;  Surgeon: 12/11/2020, MD;  Location: [facility];  Service: Obstetrics;  Laterality: N/A;  . DILATION AND EVACUATION N/A 11/23/2014   Procedure: Dilatation and Currettage with Ultrasound Guidance, Attempted Bakri Balloon Insertion ;  Surgeon: 11/25/2014. Dorien Chihuahua, MD;  Location: [facility];  Service: Gynecology;  Laterality: N/A;  . DILATION AND EVACUATION N/A 10/11/2020   Procedure: DILATATION AND EVACUATION;  Surgeon: 12/11/2020, MD;  Location: [facility];  Service: Gynecology;  Laterality: N/A;   Family History:  Family History  Problem Relation Age of Onset  . Diabetes Father   . Diabetes Paternal Uncle   . Diabetes Paternal Aunt   . Diabetes Paternal Grandmother     Social History:  reports that she has never smoked. She has never used  smokeless tobacco. She reports that she does not drink alcohol and does not use drugs.  Allergies: Patient has no known allergies.   Current Medications at time of admission:  Prior to Admission medications   Medication Sig Start Date End Date Taking? Authorizing Provider  acetaminophen (TYLENOL) 500 MG tablet Take 2 tablets (1,000 mg total) by mouth every 6 (six) hours. 10/14/20  Yes Geryl Rankins, MD  cephALEXin (KEFLEX) 500 MG capsule Take 500 mg by mouth 3 (three) times daily.   Yes [provider]  ferrous sulfate 325 (65 FE) MG tablet Take 1 tablet (325 mg total)  by mouth 2 (two) times daily with a meal. 10/01/19  Yes Nche, Bonna Gains, NP  ibuprofen (ADVIL) 200 MG tablet Take 400 mg by mouth every 6 (six) hours as needed for moderate pain.   Yes [provider]  Prenatal Vit-Fe Fumarate-FA (PRENATAL MULTIVITAMIN) TABS tablet Take 1 tablet by mouth daily at 12 noon.   Yes [provider]  iron polysaccharides (NIFEREX) 150 MG capsule Take 1 capsule (150 mg total) by mouth daily. 10/14/20   Geryl Rankins, MD  oxyCODONE (OXY IR/ROXICODONE) 5 MG immediate release tablet Take 1 tablet (5 mg total) by mouth every 4 (four) hours as needed for moderate pain, severe pain or breakthrough pain. 10/14/20   Geryl Rankins, MD    Review of Systems: Constitutional: Negative   HENT: Negative   Eyes: Negative   Respiratory: Negative   Cardiovascular: Negative   Gastrointestinal: Negative  Genitourinary: normal postpartum lochia   Musculoskeletal: Negative   Skin: Negative   Neurological: Negative   Endo/Heme/Allergies: Negative   Psychiatric/Behavioral: Negative    Physical Exam: VS: Blood pressure 131/66, pulse 77, temperature (!) 101.2 F (38.4 C), temperature source Oral, resp. rate 17, last menstrual period 01/11/2020, SpO2 100 %, unknown if currently breastfeeding. AAO x3, no signs of distress Cardiovascular: RRR Respiratory: Lung fields clear to ausculation GU/GI: tender, non-distended, no lochia on pad Extremities: BLE edema L>R, negative for pain, tenderness, and cords     Assessment: 29 y.o. [id number] POD#12 S/P Elective repeat C-section PPH w/ EBL D&C   Plan:  Admit to OB Specialty CBC Urine and blood cultures Antibiotic therapy-Amp 2G Q 6 hrs and Gent per pharmacy dosing   Dr Normand Sloop aware of admission and plan of care  Roma Schanz MSN, CNM 10/23/2020 7:38 PM

## 2020-10-23 NOTE — MAU Provider Note (Signed)
History     CSN: [id number]  Arrival date and time: 10/23/20 1627   First Provider Initiated Contact with Patient 10/23/20 1808      Chief Complaint  Patient presents with  . Vaginal Bleeding  . Abdominal Pain   Ms. Penny Abbott is a 29 y.o. year old G67P2002 female at [redacted]w[redacted]d weeks gestation who presents to MAU reporting increased abdominal pain, fever, chills this morning; temperature was 100.1 this morning.  Status post repeat C-section on October 11, 2020.  She reports a since October 19, 2020 that is now worsening.  She went to the [facility] walk-in clinic yesterday and was told of the above April replacement.  An infection after leaving a urine specimen, given prescription for Keflex 500 mg 3 times she states that her abdomen is tender to touch and also increases pain with movement; pain rated 7 out of 10.  She also has swelling in bilateral lower extremities.  She has a history of a postpartum hemorrhage with this C-section, blood loss 2296 mL.  She was taken for D&E by Dr. Richardson Dopp.  She took Tylenol this morning 1000 mg at 830 she has not taken any ibuprofen since yesterday.  She is exclusively pumping.  She denies redness, pain or any changes in breast milk supply.  She does report some dysuria when she is not relaxed using the bathroom, but when she is relaxed there is no dysuria.  She received her OB care at [facility] by Dr. Charlotta Newton and then transitioned her care to Dr. Connye Burkitt.   OB History    Gravida  2   Para  2   Term  2   Preterm      AB      Living  2     SAB      TAB      Ectopic      Multiple  0   Live Births  2           Past Medical History:  Diagnosis Date  . Blood transfusion without reported diagnosis   . History of postpartum hemorrhage, currently pregnant   . Iron (Fe) deficiency anemia   . Medical history non-contributory     Past Surgical History:  Procedure Laterality Date  . ACHILLES TENDON REPAIR    . CESAREAN SECTION N/A 11/23/2014    Procedure: CESAREAN SECTION;  Surgeon: Dorien Chihuahua. Richardson Dopp, MD;  Location: [facility];  Service: Obstetrics;  Laterality: N/A;  . CESAREAN SECTION N/A 10/11/2020   Procedure: REPEAT CESAREAN SECTION;  Surgeon: Gerald Leitz, MD;  Location: [facility];  Service: Obstetrics;  Laterality: N/A;  . DILATION AND EVACUATION N/A 11/23/2014   Procedure: Dilatation and Currettage with Ultrasound Guidance, Attempted Bakri Balloon Insertion ;  Surgeon: Dorien Chihuahua. Richardson Dopp, MD;  Location: [facility];  Service: Gynecology;  Laterality: N/A;  . DILATION AND EVACUATION N/A 10/11/2020   Procedure: DILATATION AND EVACUATION;  Surgeon: Gerald Leitz, MD;  Location: [facility];  Service: Gynecology;  Laterality: N/A;    Family History  Problem Relation Age of Onset  . Diabetes Father   . Diabetes Paternal Uncle   . Diabetes Paternal Aunt   . Diabetes Paternal Grandmother     Social History   Tobacco Use  . Smoking status: Never Smoker  . Smokeless tobacco: Never Used  Vaping Use  . Vaping Use: Never used  Substance Use Topics  . Alcohol use: No  . Drug use: No    Allergies:  No Known Allergies  Medications Prior to Admission  Medication Sig Dispense Refill Last Dose  . acetaminophen (TYLENOL) 500 MG tablet Take 2 tablets (1,000 mg total) by mouth every 6 (six) hours. 30 tablet 0 10/23/2020 at 0830  . cephALEXin (KEFLEX) 500 MG capsule Take 500 mg by mouth 3 (three) times daily.   10/23/2020 at 1530  . ferrous sulfate 325 (65 FE) MG tablet Take 1 tablet (325 mg total) by mouth 2 (two) times daily with a meal. 60 tablet 5 10/22/2020 at Unknown time  . ibuprofen (ADVIL) 200 MG tablet Take 400 mg by mouth every 6 (six) hours as needed for moderate pain.   10/22/2020 at Unknown time  . Prenatal Vit-Fe Fumarate-FA (PRENATAL MULTIVITAMIN) TABS tablet Take 1 tablet by mouth daily at 12 noon.   10/22/2020 at Unknown time  . iron polysaccharides (NIFEREX) 150 MG capsule Take 1 capsule (150 mg total) by mouth daily. 30 capsule 2   .  oxyCODONE (OXY IR/ROXICODONE) 5 MG immediate release tablet Take 1 tablet (5 mg total) by mouth every 4 (four) hours as needed for moderate pain, severe pain or breakthrough pain. 6 tablet 0     Review of Systems  Constitutional: Positive for fatigue and fever (100.1 this AM).  HENT: Negative.   Eyes: Negative.   Respiratory: Negative.   Cardiovascular: Positive for leg swelling (bilaterally).  Gastrointestinal: Positive for abdominal pain (tender to the touch and with movement).  Endocrine: Negative.   Genitourinary: Positive for vaginal bleeding (foul-smelling; worsening).       Exclusively pumping; no redness or pain in breasts  Musculoskeletal: Negative.   Skin: Negative.   Allergic/Immunologic: Negative.   Neurological: Negative.   Hematological: Negative.   Psychiatric/Behavioral: Negative.    Physical Exam   Patient Vitals for the past 24 hrs:  BP Temp Temp src Pulse Resp SpO2  10/23/20 1906 131/66 (!) 101.2 F (38.4 C) Oral 77 17 --  10/23/20 1746 139/71 (!) 103.2 F (39.6 C) Oral 92 16 --  10/23/20 1710 (!) 146/82 (!) 102.9 F (39.4 C) Oral 98 18 100 %    Physical Exam Vitals and nursing note reviewed.  Constitutional:      Appearance: She is well-developed and normal weight.  HENT:     Head: Normocephalic and atraumatic.  Abdominal:     Tenderness: There is abdominal tenderness. There is guarding. There is no right CVA tenderness or left CVA tenderness.     Comments: Cesarean incision well-healed, C/D/I  Genitourinary:    Vagina: Bleeding (foul-smelling lochia detected) present.  Skin:    Comments: Hot to the touch  Neurological:     Mental Status: She is alert and oriented to person, place, and time.  Psychiatric:        Mood and Affect: Mood normal.        Behavior: Behavior normal.        Thought Content: Thought content normal.        Judgment: Judgment normal.     MAU Course  Procedures  MDM CCUA UCx -- Results pending  CBC w/Diff Tylenol  1000 mg po   *Consult with Dr. Normand Sloop @ [phone] - notified of patient's complaints, assessments, and lab results, recommended tx plan admission, Rhea Pink, CNM will come see the patient and get her admitted  Reassessment @ 1920: Patient and spouse notified of admission and that Lizbeth Bark, CNM will come to se her and get her admitted to the hospital  Results for orders  placed or performed during the hospital encounter of 10/23/20 (from the past 24 hour(s))  Urinalysis, Routine w reflex microscopic Urine, Clean Catch     Status: Abnormal   Collection Time: 10/23/20  5:53 PM  Result Value Ref Range   Color, Urine YELLOW YELLOW   APPearance HAZY (A) CLEAR   Specific Gravity, Urine 1.016 1.005 - 1.030   pH 8.0 5.0 - 8.0   Glucose, UA NEGATIVE NEGATIVE mg/dL   Hgb urine dipstick MODERATE (A) NEGATIVE   Bilirubin Urine NEGATIVE NEGATIVE   Ketones, ur NEGATIVE NEGATIVE mg/dL   Protein, ur NEGATIVE NEGATIVE mg/dL   Nitrite NEGATIVE NEGATIVE   Leukocytes,Ua LARGE (A) NEGATIVE   RBC / HPF 0-5 0 - 5 RBC/hpf   WBC, UA >50 (H) 0 - 5 WBC/hpf   Bacteria, UA FEW (A) NONE SEEN   Squamous Epithelial / LPF 0-5 0 - 5   Mucus PRESENT   CBC with Differential/Platelet     Status: Abnormal   Collection Time: 10/23/20  6:08 PM  Result Value Ref Range   WBC 17.5 (H) 4.0 - 10.5 K/uL   RBC 2.67 (L) 3.87 - 5.11 MIL/uL   Hemoglobin 8.2 (L) 12.0 - 15.0 g/dL   HCT 93.7 (L) 36 - 46 %   MCV 100.0 80.0 - 100.0 fL   MCH 30.7 26.0 - 34.0 pg   MCHC 30.7 30.0 - 36.0 g/dL   RDW 34.2 (H) 87.6 - 81.1 %   Platelets 339 150 - 400 K/uL   nRBC 0.3 (H) 0.0 - 0.2 %   Neutrophils Relative % 85 %   Neutro Abs 14.9 (H) 1.7 - 7.7 K/uL   Lymphocytes Relative 9 %   Lymphs Abs 1.5 0.7 - 4.0 K/uL   Monocytes Relative 5 %   Monocytes Absolute 0.9 0.1 - 1.0 K/uL   Eosinophils Relative 0 %   Eosinophils Absolute 0.0 0.0 - 0.5 K/uL   Basophils Relative 0 %   Basophils Absolute 0.0 0.0 - 0.1 K/uL   Immature Granulocytes 1 %    Abs Immature Granulocytes 0.16 (H) 0.00 - 0.07 K/uL    Assessment and Plan  Endometritis - [facility] CNM Lizbeth Bark assumes care of patient at 54 - See H&P from V. Grice,CNM for plan of care  Raelyn Mora, MSN, CNM 10/23/2020, 6:08 PM

## 2020-10-24 LAB — CULTURE, OB URINE: Culture: 10000 — AB

## 2020-10-24 LAB — CBC WITH DIFFERENTIAL/PLATELET
Abs Immature Granulocytes: 0.09 10*3/uL — ABNORMAL HIGH (ref 0.00–0.07)
Basophils Absolute: 0 10*3/uL (ref 0.0–0.1)
Basophils Relative: 0 %
Eosinophils Absolute: 0.1 10*3/uL (ref 0.0–0.5)
Eosinophils Relative: 1 %
HCT: 25.6 % — ABNORMAL LOW (ref 36.0–46.0)
Hemoglobin: 7.5 g/dL — ABNORMAL LOW (ref 12.0–15.0)
Immature Granulocytes: 1 %
Lymphocytes Relative: 17 %
Lymphs Abs: 1.8 10*3/uL (ref 0.7–4.0)
MCH: 29.6 pg (ref 26.0–34.0)
MCHC: 29.3 g/dL — ABNORMAL LOW (ref 30.0–36.0)
MCV: 101.2 fL — ABNORMAL HIGH (ref 80.0–100.0)
Monocytes Absolute: 0.9 10*3/uL (ref 0.1–1.0)
Monocytes Relative: 9 %
Neutro Abs: 7.6 10*3/uL (ref 1.7–7.7)
Neutrophils Relative %: 72 %
Platelets: 295 10*3/uL (ref 150–400)
RBC: 2.53 MIL/uL — ABNORMAL LOW (ref 3.87–5.11)
RDW: 17.2 % — ABNORMAL HIGH (ref 11.5–15.5)
WBC: 10.5 10*3/uL (ref 4.0–10.5)
nRBC: 0.2 % (ref 0.0–0.2)

## 2020-10-24 MED ORDER — FERROUS SULFATE 325 (65 FE) MG PO TABS: 325.0000 mg | ORAL_TABLET | Freq: Two times a day (BID) | ORAL | Status: DC

## 2020-10-24 MED ORDER — FERROUS SULFATE 325 (65 FE) MG PO TABS
325.0000 mg | ORAL_TABLET | Freq: Every day | ORAL | Status: DC
  Administered 2020-10-24 – 2020-10-25 (×2): 325 mg via ORAL
  Filled 2020-10-24 (×2): qty 1

## 2020-10-24 MED ORDER — CLINDAMYCIN PHOSPHATE 900 MG/50ML IV SOLN
900.0000 mg | Freq: Three times a day (TID) | INTRAVENOUS | Status: AC
  Administered 2020-10-24 – 2020-10-25 (×3): 900 mg via INTRAVENOUS
  Filled 2020-10-24 (×3): qty 50

## 2020-10-24 NOTE — Consult Note (Signed)
ANTIBIOTIC CONSULT NOTE - INITIAL  Pharmacy Consult for Gentamicin Indication: possible endometritis   No Known Allergies  Patient Measurements: Height: 5\' 6"  (167.6 cm) Weight: 83.3 kg (183 lb 10.3 oz) IBW/kg (Calculated) : 59.3 Adjusted Body Weight: 70.2 kg  Vital Signs: Temp: 98.3 F (36.8 C) (10/25 0355) Temp Source: Oral (10/25 0355) BP: 113/66 (10/25 0355) Pulse Rate: 64 (10/25 0355)  Labs: Recent Labs    10/23/20 1808 10/24/20 0644  WBC 17.5* 10.5  HGB 8.2* 7.5*  PLT 339 295   No results for input(s): GENTTROUGH, GENTPEAK, GENTRANDOM in the last 72 hours.   Microbiology: Recent Results (from the past 720 hour(s))  Respiratory Panel by RT PCR (Flu A&B, Covid) - Nasopharyngeal Swab     Status: None   Collection Time: 10/09/20  8:17 AM   Specimen: Nasopharyngeal Swab  Result Value Ref Range Status   SARS Coronavirus 2 by RT PCR NEGATIVE NEGATIVE Final    Comment: (NOTE) SARS-CoV-2 target nucleic acids are NOT DETECTED.  The SARS-CoV-2 RNA is generally detectable in upper respiratoy specimens during the acute phase of infection. The lowest concentration of SARS-CoV-2 viral copies this assay can detect is 131 copies/mL. A negative result does not preclude SARS-Cov-2 infection and should not be used as the sole basis for treatment or other patient management decisions. A negative result may occur with  improper specimen collection/handling, submission of specimen other than nasopharyngeal swab, presence of viral mutation(s) within the areas targeted by this assay, and inadequate number of viral copies (<131 copies/mL). A negative result must be combined with clinical observations, patient history, and epidemiological information. The expected result is Negative.  Fact Sheet for Patients:  12/09/20  Fact Sheet for Healthcare Providers:  https://www.moore.com/  This test is no t yet approved or cleared by  the https://www.young.biz/ FDA and  has been authorized for detection and/or diagnosis of SARS-CoV-2 by FDA under an Emergency Use Authorization (EUA). This EUA will remain  in effect (meaning this test can be used) for the duration of the COVID-19 declaration under Section 564(b)(1) of the Act, 21 U.S.C. section 360bbb-3(b)(1), unless the authorization is terminated or revoked sooner.     Influenza A by PCR NEGATIVE NEGATIVE Final   Influenza B by PCR NEGATIVE NEGATIVE Final    Comment: (NOTE) The Xpert Xpress SARS-CoV-2/FLU/RSV assay is intended as an aid in  the diagnosis of influenza from Nasopharyngeal swab specimens and  should not be used as a sole basis for treatment. Nasal washings and  aspirates are unacceptable for Xpert Xpress SARS-CoV-2/FLU/RSV  testing.  Fact Sheet for Patients: [place]  Fact Sheet for Healthcare Providers: https://www.moore.com/  This test is not yet approved or cleared by the https://www.young.biz/ FDA and  has been authorized for detection and/or diagnosis of SARS-CoV-2 by  FDA under an Emergency Use Authorization (EUA). This EUA will remain  in effect (meaning this test can be used) for the duration of the  Covid-19 declaration under Section 564(b)(1) of the Act, 21  U.S.C. section 360bbb-3(b)(1), unless the authorization is  terminated or revoked. Performed at [facility], [address] [place], [place]   Respiratory Panel by RT PCR (Flu A&B, Covid) - Nasopharyngeal Swab     Status: None   Collection Time: 10/23/20  8:29 PM   Specimen: Nasopharyngeal Swab  Result Value Ref Range Status   SARS Coronavirus 2 by RT PCR NEGATIVE NEGATIVE Final    Comment: (NOTE) SARS-CoV-2 target nucleic acids are  NOT DETECTED.  The SARS-CoV-2 RNA is generally detectable in upper respiratoy specimens during the acute phase of infection. The lowest concentration of SARS-CoV-2 viral copies this assay can  detect is 131 copies/mL. A negative result does not preclude SARS-Cov-2 infection and should not be used as the sole basis for treatment or other patient management decisions. A negative result may occur with  improper specimen collection/handling, submission of specimen other than nasopharyngeal swab, presence of viral mutation(s) within the areas targeted by this assay, and inadequate number of viral copies (<131 copies/mL). A negative result must be combined with clinical observations, patient history, and epidemiological information. The expected result is Negative.  Fact Sheet for Patients:  https://www.moore.com/  Fact Sheet for Healthcare Providers:  https://www.young.biz/  This test is no t yet approved or cleared by the [place] FDA and  has been authorized for detection and/or diagnosis of SARS-CoV-2 by FDA under an Emergency Use Authorization (EUA). This EUA will remain  in effect (meaning this test can be used) for the duration of the COVID-19 declaration under Section 564(b)(1) of the Act, 21 U.S.C. section 360bbb-3(b)(1), unless the authorization is terminated or revoked sooner.     Influenza A by PCR NEGATIVE NEGATIVE Final   Influenza B by PCR NEGATIVE NEGATIVE Final    Comment: (NOTE) The Xpert Xpress SARS-CoV-2/FLU/RSV assay is intended as an aid in  the diagnosis of influenza from Nasopharyngeal swab specimens and  should not be used as a sole basis for treatment. Nasal washings and  aspirates are unacceptable for Xpert Xpress SARS-CoV-2/FLU/RSV  testing.  Fact Sheet for Patients: https://www.moore.com/  Fact Sheet for Healthcare Providers: https://www.young.biz/  This test is not yet approved or cleared by the [place] FDA and  has been authorized for detection and/or diagnosis of SARS-CoV-2 by  FDA under an Emergency Use Authorization (EUA). This EUA will remain  in  effect (meaning this test can be used) for the duration of the  Covid-19 declaration under Section 564(b)(1) of the Act, 21  U.S.C. section 360bbb-3(b)(1), unless the authorization is  terminated or revoked. Performed at [facility], [address] [place], [place] [address]     Medications:  - ampicillin 2g Q6H (10/24-10/25)  - clindamycin 900mg  Q8H (10/25>> - gentamicin 350 mg (5 mg/kg) (10/24>>   Assessment: 29 y.o. female G2P2002 presenting with chills, fever, and abdominal pain s/p repeat c-section on 10/11/20.   Goal of Therapy:  Gentamicin peak 6-8 mg/L and Trough < 1 mg/L  Plan:  Gentamicin 350 mg IV every 24 hrs  Check Scr with next labs if gentamicin continued. Will check gentamicin levels if continued > 72hr or clinically indicated.   Thanks,  12/11/20, PharmD, [facility] PGY2 Pediatric Pharmacy Resident

## 2020-10-24 NOTE — Plan of Care (Signed)
Admitted for postpartum infection with fever of 103. Antibiotics started and antipyretics. Patient stated she has a pain level of 2-3.Vaginal discharge is old  brown foul smelling vaginal d/c. Plan of care dicussed with patient and she voiced her understanding.

## 2020-10-24 NOTE — Progress Notes (Addendum)
OBGYN Progress Note  S:  Patient is doing well.  Reports feeling significantly better since starting antibiotics.  Eating regular diet without issue.  Ambulating and voiding normally as well.  She is breastfeeding/pumping and her significant other is bringing her baby in today.  Denies fevers, chills, chest pain, SOB, abdominal pain, pelvic pain, or bilateral LE edema.  Lochia: Minimal Infant feeding:  Breast  O: Temp:  [98.2 F (36.8 C)-103.2 F (39.6 C)] 98.3 F (36.8 C) (10/25 0355) Pulse Rate:  [63-98] 64 (10/25 0355) Resp:  [15-18] 16 (10/25 0355) BP: (112-146)/(65-82) 113/66 (10/25 0355) SpO2:  [95 %-100 %] 100 % (10/25 0355) Weight:  [83.3 kg] 83.3 kg (10/24 2149) Gen: NAD, pleasant and cooperative Resp: No increased work of breathing Abdomen: soft, non-distended, non-tender throughout Uterus: firm, non-tender throughout, below umbilicus Incision: c/d/i, steri strips were removed, no erythema surrounding incision Ext: trace bilateral LE edema, no bilateral calf tenderness  Labs:  Results for orders placed or performed during the hospital encounter of 10/23/20  Culture, blood (routine x 2)   Specimen: BLOOD  Result Value Ref Range   Specimen Description BLOOD LEFT ANTECUBITAL    Special Requests      BOTTLES DRAWN AEROBIC AND ANAEROBIC Blood Culture adequate volume   Culture      NO GROWTH < 24 HOURS Performed at [facility], [address], [place], [place] [address]    Report Status PENDING   Culture, blood (routine x 2)   Specimen: BLOOD RIGHT HAND  Result Value Ref Range   Specimen Description BLOOD RIGHT HAND    Special Requests      BOTTLES DRAWN AEROBIC AND ANAEROBIC Blood Culture adequate volume   Culture      NO GROWTH < 24 HOURS Performed at [facility], [address], [place], [place] [address]    Report Status PENDING   Respiratory Panel by RT PCR (Flu A&B, Covid) - Nasopharyngeal Swab   Specimen: Nasopharyngeal Swab  Result Value Ref  Range   SARS Coronavirus 2 by RT PCR NEGATIVE NEGATIVE   Influenza A by PCR NEGATIVE NEGATIVE   Influenza B by PCR NEGATIVE NEGATIVE  Urinalysis, Routine w reflex microscopic Urine, Clean Catch  Result Value Ref Range   Color, Urine YELLOW YELLOW   APPearance HAZY (A) CLEAR   Specific Gravity, Urine 1.016 1.005 - 1.030   pH 8.0 5.0 - 8.0   Glucose, UA NEGATIVE NEGATIVE mg/dL   Hgb urine dipstick MODERATE (A) NEGATIVE   Bilirubin Urine NEGATIVE NEGATIVE   Ketones, ur NEGATIVE NEGATIVE mg/dL   Protein, ur NEGATIVE NEGATIVE mg/dL   Nitrite NEGATIVE NEGATIVE   Leukocytes,Ua LARGE (A) NEGATIVE   RBC / HPF 0-5 0 - 5 RBC/hpf   WBC, UA >50 (H) 0 - 5 WBC/hpf   Bacteria, UA FEW (A) NONE SEEN   Squamous Epithelial / LPF 0-5 0 - 5   Mucus PRESENT   CBC with Differential/Platelet  Result Value Ref Range   WBC 17.5 (H) 4.0 - 10.5 K/uL   RBC 2.67 (L) 3.87 - 5.11 MIL/uL   Hemoglobin 8.2 (L) 12.0 - 15.0 g/dL   HCT 87.5 (L) 36 - 46 %   MCV 100.0 80.0 - 100.0 fL   MCH 30.7 26.0 - 34.0 pg   MCHC 30.7 30.0 - 36.0 g/dL   RDW 64.3 (H) 32.9 - 51.8 %   Platelets 339 150 - 400 K/uL   nRBC 0.3 (H) 0.0 - 0.2 %  Neutrophils Relative % 85 %   Neutro Abs 14.9 (H) 1.7 - 7.7 K/uL   Lymphocytes Relative 9 %   Lymphs Abs 1.5 0.7 - 4.0 K/uL   Monocytes Relative 5 %   Monocytes Absolute 0.9 0.1 - 1.0 K/uL   Eosinophils Relative 0 %   Eosinophils Absolute 0.0 0.0 - 0.5 K/uL   Basophils Relative 0 %   Basophils Absolute 0.0 0.0 - 0.1 K/uL   Immature Granulocytes 1 %   Abs Immature Granulocytes 0.16 (H) 0.00 - 0.07 K/uL  CBC with Differential/Platelet  Result Value Ref Range   WBC 10.5 4.0 - 10.5 K/uL   RBC 2.53 (L) 3.87 - 5.11 MIL/uL   Hemoglobin 7.5 (L) 12.0 - 15.0 g/dL   HCT 95.2 (L) 36 - 46 %   MCV 101.2 (H) 80.0 - 100.0 fL   MCH 29.6 26.0 - 34.0 pg   MCHC 29.3 (L) 30.0 - 36.0 g/dL   RDW 84.1 (H) 32.4 - 40.1 %   Platelets 295 150 - 400 K/uL   nRBC 0.2 0.0 - 0.2 %   Neutrophils Relative % 72 %    Neutro Abs 7.6 1.7 - 7.7 K/uL   Lymphocytes Relative 17 %   Lymphs Abs 1.8 0.7 - 4.0 K/uL   Monocytes Relative 9 %   Monocytes Absolute 0.9 0.1 - 1.0 K/uL   Eosinophils Relative 1 %   Eosinophils Absolute 0.1 0.0 - 0.5 K/uL   Basophils Relative 0 %   Basophils Absolute 0.0 0.0 - 0.1 K/uL   Immature Granulocytes 1 %   Abs Immature Granulocytes 0.09 (H) 0.00 - 0.07 K/uL  Type and screen [facility]  Result Value Ref Range   ABO/RH(D) A POS    Antibody Screen NEG    Sample Expiration      10/26/2020,2359 Performed at [facility], [address], [place], [place] [address]     A/P: Patient is a 29 y.o. [phone] s/p repeat LTCS and Dilation and Curettage on 10/11/20 who is admitted for postpartum endometritis.  - Pain well controlled  - GU: Voiding spontaneously - GI: Tolerating regular diet - Activity: encouraged sitting up to chair and ambulation as tolerated - DVT Prophylaxis: SCDs - Labs:  Improving - Blood cultures:  No grwoth - Antibiotics:  S/p Ampicillin x 2 doses, Gentamicin/Clindamycin ordered  Disposition:  Likely 10/26 or 10/27.  Steva Ready, DO [phone] (office)

## 2020-10-25 LAB — CBC WITH DIFFERENTIAL/PLATELET
Abs Immature Granulocytes: 0.05 10*3/uL (ref 0.00–0.07)
Basophils Absolute: 0 10*3/uL (ref 0.0–0.1)
Basophils Relative: 0 %
Eosinophils Absolute: 0.1 10*3/uL (ref 0.0–0.5)
Eosinophils Relative: 1 %
HCT: 27.9 % — ABNORMAL LOW (ref 36.0–46.0)
Hemoglobin: 8.4 g/dL — ABNORMAL LOW (ref 12.0–15.0)
Immature Granulocytes: 1 %
Lymphocytes Relative: 20 %
Lymphs Abs: 1.4 10*3/uL (ref 0.7–4.0)
MCH: 29.7 pg (ref 26.0–34.0)
MCHC: 30.1 g/dL (ref 30.0–36.0)
MCV: 98.6 fL (ref 80.0–100.0)
Monocytes Absolute: 0.8 10*3/uL (ref 0.1–1.0)
Monocytes Relative: 11 %
Neutro Abs: 4.5 10*3/uL (ref 1.7–7.7)
Neutrophils Relative %: 67 %
Platelets: 297 10*3/uL (ref 150–400)
RBC: 2.83 MIL/uL — ABNORMAL LOW (ref 3.87–5.11)
RDW: 16.6 % — ABNORMAL HIGH (ref 11.5–15.5)
WBC: 6.8 10*3/uL (ref 4.0–10.5)
nRBC: 0 % (ref 0.0–0.2)

## 2020-10-25 LAB — COMPREHENSIVE METABOLIC PANEL
ALT: 60 U/L — ABNORMAL HIGH (ref 0–44)
AST: 29 U/L (ref 15–41)
Albumin: 2.9 g/dL — ABNORMAL LOW (ref 3.5–5.0)
Alkaline Phosphatase: 66 U/L (ref 38–126)
Anion gap: 9 (ref 5–15)
BUN: 9 mg/dL (ref 6–20)
CO2: 22 mmol/L (ref 22–32)
Calcium: 8.8 mg/dL — ABNORMAL LOW (ref 8.9–10.3)
Chloride: 110 mmol/L (ref 98–111)
Creatinine, Ser: 0.68 mg/dL (ref 0.44–1.00)
GFR, Estimated: >60 mL/min (ref >60)
Glucose, Bld: 88 mg/dL (ref 70–99)
Potassium: 3.8 mmol/L (ref 3.5–5.1)
Sodium: 141 mmol/L (ref 135–145)
Total Bilirubin: 0.5 mg/dL (ref 0.3–1.2)
Total Protein: 5.8 g/dL — ABNORMAL LOW (ref 6.5–8.1)

## 2020-10-25 LAB — LACTATE DEHYDROGENASE: LDH: 203 U/L — ABNORMAL HIGH (ref 98–192)

## 2020-10-25 LAB — PROTEIN / CREATININE RATIO, URINE
Creatinine, Urine: 50.76 mg/dL
Protein Creatinine Ratio: 0.22 mg/mg{Cre} — ABNORMAL HIGH (ref 0.00–0.15)
Total Protein, Urine: 11 mg/dL

## 2020-10-25 MED ORDER — NIFEDIPINE ER OSMOTIC RELEASE 30 MG PO TB24
30.0000 mg | ORAL_TABLET | Freq: Every day | ORAL | Status: DC
  Administered 2020-10-25: 30 mg via ORAL
  Filled 2020-10-25: qty 1

## 2020-10-25 MED ORDER — NIFEDIPINE ER 30 MG PO TB24: 30.0000 mg | ORAL_TABLET | Freq: Every day | ORAL | 3 refills | Status: DC

## 2020-10-25 NOTE — Progress Notes (Addendum)
OBGYN Progress Note  S:  Patient is doing well.  Reports feeling significantly better since starting antibiotics.  Eating regular diet without issue.  Ambulating and voiding normally as well.  She is breastfeeding/pumping and her significant other is bringing her baby in today.    Denies fevers, chills, chest pain, visual changes, SOB, RUQ/epigastric pain, N/V, dysuria, hematuria, or sudden onset/worsening bilateral LE or facial edema.  Lochia: Minimal Infant feeding:  Breast  O: Temp:  [98 F (36.7 C)-98.6 F (37 C)] 98.2 F (36.8 C) (10/26 0444) Pulse Rate:  [48-70] 51 (10/26 0444) Resp:  [16-19] 18 (10/26 0444) BP: (118-157)/(66-91) 157/87 (10/26 0444) SpO2:  [99 %-100 %] 99 % (10/26 0445) Gen: NAD, pleasant and cooperative Cardio:  Regular rhythm, bradycardia to about 50bpm, no murmurs, rubs, gallops Resp: No increased work of breathing Abdomen: soft, non-distended, non-tender throughout Uterus: firm, non-tender throughout, below umbilicus Incision: c/d/i, no erythema surrounding incision Ext: trace bilateral LE edema, no bilateral calf tenderness  Labs:  Results for orders placed or performed during the hospital encounter of 10/23/20  Culture, OB Urine   Specimen: OB Clean Catch; Urine  Result Value Ref Range   Specimen Description OB CLEAN CATCH    Special Requests Immunocompromised    Culture (A)     10,000 COLONIES/mL DIPHTHEROIDS(CORYNEBACTERIUM SPECIES) NO GROUP B STREP (S.AGALACTIAE) ISOLATED Performed at [facility], [address], [place], [place] [address]    Report Status 10/24/2020 FINAL   Culture, blood (routine x 2)   Specimen: BLOOD  Result Value Ref Range   Specimen Description BLOOD LEFT ANTECUBITAL    Special Requests      BOTTLES DRAWN AEROBIC AND ANAEROBIC Blood Culture adequate volume   Culture      NO GROWTH 2 DAYS Performed at [facility], [address], [place], [place] [address]    Report Status PENDING   Culture, blood  (routine x 2)   Specimen: BLOOD RIGHT HAND  Result Value Ref Range   Specimen Description BLOOD RIGHT HAND    Special Requests      BOTTLES DRAWN AEROBIC AND ANAEROBIC Blood Culture adequate volume   Culture      NO GROWTH 2 DAYS Performed at [facility], [address] [place], [place] [address]    Report Status PENDING   Respiratory Panel by RT PCR (Flu A&B, Covid) - Nasopharyngeal Swab   Specimen: Nasopharyngeal Swab  Result Value Ref Range   SARS Coronavirus 2 by RT PCR NEGATIVE NEGATIVE   Influenza A by PCR NEGATIVE NEGATIVE   Influenza B by PCR NEGATIVE NEGATIVE  Urinalysis, Routine w reflex microscopic Urine, Clean Catch  Result Value Ref Range   Color, Urine YELLOW YELLOW   APPearance HAZY (A) CLEAR   Specific Gravity, Urine 1.016 1.005 - 1.030   pH 8.0 5.0 - 8.0   Glucose, UA NEGATIVE NEGATIVE mg/dL   Hgb urine dipstick MODERATE (A) NEGATIVE   Bilirubin Urine NEGATIVE NEGATIVE   Ketones, ur NEGATIVE NEGATIVE mg/dL   Protein, ur NEGATIVE NEGATIVE mg/dL   Nitrite NEGATIVE NEGATIVE   Leukocytes,Ua LARGE (A) NEGATIVE   RBC / HPF 0-5 0 - 5 RBC/hpf   WBC, UA >50 (H) 0 - 5 WBC/hpf   Bacteria, UA FEW (A) NONE SEEN   Squamous Epithelial / LPF 0-5 0 - 5   Mucus PRESENT   CBC with Differential/Platelet  Result Value Ref Range   WBC 17.5 (H) 4.0 - 10.5 K/uL  RBC 2.67 (L) 3.87 - 5.11 MIL/uL   Hemoglobin 8.2 (L) 12.0 - 15.0 g/dL   HCT 35.3 (L) 36 - 46 %   MCV 100.0 80.0 - 100.0 fL   MCH 30.7 26.0 - 34.0 pg   MCHC 30.7 30.0 - 36.0 g/dL   RDW 29.9 (H) 24.2 - 68.3 %   Platelets 339 150 - 400 K/uL   nRBC 0.3 (H) 0.0 - 0.2 %   Neutrophils Relative % 85 %   Neutro Abs 14.9 (H) 1.7 - 7.7 K/uL   Lymphocytes Relative 9 %   Lymphs Abs 1.5 0.7 - 4.0 K/uL   Monocytes Relative 5 %   Monocytes Absolute 0.9 0.1 - 1.0 K/uL   Eosinophils Relative 0 %   Eosinophils Absolute 0.0 0.0 - 0.5 K/uL   Basophils Relative 0 %   Basophils Absolute 0.0 0.0 - 0.1 K/uL   Immature  Granulocytes 1 %   Abs Immature Granulocytes 0.16 (H) 0.00 - 0.07 K/uL  CBC with Differential/Platelet  Result Value Ref Range   WBC 10.5 4.0 - 10.5 K/uL   RBC 2.53 (L) 3.87 - 5.11 MIL/uL   Hemoglobin 7.5 (L) 12.0 - 15.0 g/dL   HCT 41.9 (L) 36 - 46 %   MCV 101.2 (H) 80.0 - 100.0 fL   MCH 29.6 26.0 - 34.0 pg   MCHC 29.3 (L) 30.0 - 36.0 g/dL   RDW 62.2 (H) 29.7 - 98.9 %   Platelets 295 150 - 400 K/uL   nRBC 0.2 0.0 - 0.2 %   Neutrophils Relative % 72 %   Neutro Abs 7.6 1.7 - 7.7 K/uL   Lymphocytes Relative 17 %   Lymphs Abs 1.8 0.7 - 4.0 K/uL   Monocytes Relative 9 %   Monocytes Absolute 0.9 0.1 - 1.0 K/uL   Eosinophils Relative 1 %   Eosinophils Absolute 0.1 0.0 - 0.5 K/uL   Basophils Relative 0 %   Basophils Absolute 0.0 0.0 - 0.1 K/uL   Immature Granulocytes 1 %   Abs Immature Granulocytes 0.09 (H) 0.00 - 0.07 K/uL  Type and screen [facility]  Result Value Ref Range   ABO/RH(D) A POS    Antibody Screen NEG    Sample Expiration      10/26/2020,2359 Performed at [facility], [address], [place], [place] [address]     A/P: Patient is a 29 y.o. [phone] s/p repeat LTCS and Dilation and Curettage on 10/11/20 who is admitted for postpartum endometritis.  Postpartum endometritis - Pain well controlled  - GU: Voiding spontaneously - GI: Tolerating regular diet - Activity: encouraged sitting up to chair and ambulation as tolerated - DVT Prophylaxis: SCDs - Labs:  Improving - Blood cultures:  NGTD x 2d - Urine culture 10K diphtheroids (corynebacterium species) -- suspect secondary to contaminated lochia, confirmed with pharmacy on 10/27 that this is covered by both [employer] and [place] which patient received adequate treatment - Antibiotics:  S/p Ampicillin x 2 doses, S/p Gent/Clinda, given absence of fevers and clinical improvement, antibiotics discontinued  Bradycardia - Baseline upon admission 59-70 - Heart rate over last 12 hours 48-57 - EKG  preliminary read revealed sinus bradycardia with concern for anterolateral infarct -- reviewed with Dr. Anne Fu (Cardiology) and EKG on his read does not suggest anterolateral infarct - Suspect bradycardia secondary to vagal response secondary to pain vs anemia vs stress - Asymptomatic, will continue to monitor  Elevated blood pressures - Suspect gestational HTN vs stress -  Asymtpomatic - HELLP labs and P/C ratio ordered to rule out postpartum pre-eclampsia    Disposition:  Likely 10/27  Steva Ready, DO [phone] (office)

## 2020-10-25 NOTE — Discharge Summary (Addendum)
Postpartum Discharge Summary  10/25/20    Patient Name: Penny Abbott DOB: [birth year] MRN: [id number]  Date of admission: 10/23/2020 Delivery date:10/11/2020  Delivering provider: Gerald Leitz  Date of discharge: 10/25/2020  Admitting diagnosis: Post-operative infection [T81.40XA] Intrauterine pregnancy: [redacted]w[redacted]d     Secondary diagnosis:  Active Problems:   Post-operative infection  Additional problems: Gestational Hypertension, Bradycardia   Discharge diagnosis: Gestational Hypertension                                               Hospital course:  Patient was readmitted on 10/24 for postpartum endometritis.  She is status post low transverse cesarean section followed by dilation and curettage due to postpartum hemorrhage on 10/11/20.  She presented with pelvic pain and fevers with her highest temperature being 103 Farenheit.  She received intravenous antibiotics (Ampicillin/Gentamicin/Clindamycin) and her symptoms improved and she remained afebrile.  During her hospital stay she had mildly elevated blood pressures but her HELLP labs were negative and P/C (in and out catheter) ratio was 0.22 (wnl).  She was also noted to be bradycardic as low as 45bpm without symptoms.  EKG revealed sinus bradycardia.  EKG was discussed with cardiology and no further pathologic findings were noted.  Bradycardia improved prior to discharge.  Physical exam  Vitals:   10/25/20 0812 10/25/20 1130 10/25/20 1520 10/25/20 1825  BP: (!) 155/74 121/75 118/70 140/83  Pulse: (!) 45 (!) 57 (!) 59 80  Resp: 18 18 18    Temp: 98.6 F (37 C) 98.4 F (36.9 C) 99.2 F (37.3 C)   TempSrc: Oral Oral Oral   SpO2: 100% 100% 100%   Weight:      Height:       General: alert, cooperative and no distress Lochia: appropriate Uterine Fundus: firm and non-tender Incision: Healing well with no significant drainage DVT Evaluation: No evidence of DVT seen on physical exam. No cords or calf tenderness. Labs: Lab Results   Component Value Date   WBC 6.8 10/25/2020   HGB 8.4 (L) 10/25/2020   HCT 27.9 (L) 10/25/2020   MCV 98.6 10/25/2020   PLT 297 10/25/2020   CMP Latest Ref Rng & Units 10/25/2020  Glucose 70 - 99 mg/dL 88  BUN 6 - 20 mg/dL 9  Creatinine 10/27/2020 - 1.88 mg/dL 4.16  Sodium 6.06 - 301 mmol/L 141  Potassium 3.5 - 5.1 mmol/L 3.8  Chloride 98 - 111 mmol/L 110  CO2 22 - 32 mmol/L 22  Calcium 8.9 - 10.3 mg/dL 601)  Total Protein 6.5 - 8.1 g/dL 0.9(N)  Total Bilirubin 0.3 - 1.2 mg/dL 0.5  Alkaline Phos 38 - 126 U/L 66  AST 15 - 41 U/L 29  ALT 0 - 44 U/L 60(H)   2.3(F Score: Edinburgh Postnatal Depression Scale Screening Tool 10/12/2020  I have been able to laugh and see the funny side of things. 0  I have looked forward with enjoyment to things. 0  I have blamed myself unnecessarily when things went wrong. 0  I have been anxious or worried for no good reason. 0  I have felt scared or panicky for no good reason. 1  Things have been getting on top of me. 0  I have been so unhappy that I have had difficulty sleeping. 0  I have felt sad or miserable. 0  I have been so unhappy that I have  been crying. 0  The thought of harming myself has occurred to me. 0  Edinburgh Postnatal Depression Scale Total 1      After visit meds:  Allergies as of 10/25/2020   No Known Allergies     Medication List    STOP taking these medications   cephALEXin 500 MG capsule Commonly known as: KEFLEX     TAKE these medications   acetaminophen 500 MG tablet Commonly known as: TYLENOL Take 2 tablets (1,000 mg total) by mouth every 6 (six) hours.   ferrous sulfate 325 (65 FE) MG tablet Take 1 tablet (325 mg total) by mouth 2 (two) times daily with a meal.   ibuprofen 200 MG tablet Commonly known as: ADVIL Take 400 mg by mouth every 6 (six) hours as needed for moderate pain.   iron polysaccharides 150 MG capsule Commonly known as: NIFEREX Take 1 capsule (150 mg total) by mouth daily.    NIFEdipine 30 MG 24 hr tablet Commonly known as: ADALAT CC Take 1 tablet (30 mg total) by mouth daily. Start taking on: October 26, 2020   oxyCODONE 5 MG immediate release tablet Commonly known as: Oxy IR/ROXICODONE Take 1 tablet (5 mg total) by mouth every 4 (four) hours as needed for moderate pain, severe pain or breakthrough pain.   prenatal multivitamin Tabs tablet Take 1 tablet by mouth daily at 12 noon.        Discharge home in stable condition  Discharge instruction: per After Visit Summary and Postpartum booklet, Preeclampsia and infectious precautions were discussed in full Activity: Advance as tolerated. Pelvic rest for 6 weeks.  Diet: routine diet Postpartum Appointment:1 day (10/26) as previously scheduled Additional Postpartum F/U: BP check 1 day Future Appointments:No future appointments. Follow up Visit:  Follow-up Information    Steva Ready, DO Follow up in 1 day(s).   Specialty: Obstetrics and Gynecology Contact information: [address] [place] [address] [phone]                  10/25/2020 Steva Ready, DO

## 2020-10-28 LAB — CULTURE, BLOOD (ROUTINE X 2)
Culture: NO GROWTH
Culture: NO GROWTH
Special Requests: ADEQUATE
Special Requests: ADEQUATE

## 2020-12-12 ENCOUNTER — Other Ambulatory Visit: Payer: Self-pay

## 2020-12-13 ENCOUNTER — Ambulatory Visit: Payer: BC Managed Care – PPO | Admitting: Nurse Practitioner

## 2020-12-15 DIAGNOSIS — Z20822 Contact with and (suspected) exposure to covid-19: Secondary | ICD-10-CM | POA: Diagnosis not present

## 2020-12-20 ENCOUNTER — Telehealth: Payer: Self-pay

## 2020-12-20 ENCOUNTER — Encounter: Payer: Self-pay | Admitting: Nurse Practitioner

## 2020-12-20 NOTE — Telephone Encounter (Signed)
Patient calling to get a form for exemption signed and faxed, as pt tested positive for Covid and this is needed for her place of employment.  Pt will attach to MYchart in a message to be signed. I informed pt that [facility] is out of office today.  Pt asked if PCP of the day can get it filled out.  I told her that I would send a message back to nurse. Fax#[phone]

## 2020-12-20 NOTE — Telephone Encounter (Signed)
Pt informed and she is going to send the positive test for Covid in her Mychart.  Pt also will need a date to receive the ok to get the vaccine.

## 2020-12-20 NOTE — Telephone Encounter (Signed)
Left message on voicemail to call office.  To ask for her to send in test results.

## 2020-12-22 NOTE — Telephone Encounter (Signed)
Please see message and advise.  Thank you. ° °

## 2021-01-02 ENCOUNTER — Other Ambulatory Visit: Payer: Self-pay

## 2021-01-03 ENCOUNTER — Encounter: Payer: Self-pay | Admitting: Nurse Practitioner

## 2021-01-03 ENCOUNTER — Ambulatory Visit: Payer: BC Managed Care – PPO | Admitting: Nurse Practitioner

## 2021-01-03 VITALS — BP 112/80 | HR 86 | Temp 96.9°F | Ht 66.0 in | Wt 154.0 lb

## 2021-01-03 DIAGNOSIS — I10 Essential (primary) hypertension: Secondary | ICD-10-CM | POA: Diagnosis not present

## 2021-01-03 DIAGNOSIS — D508 Other iron deficiency anemias: Secondary | ICD-10-CM | POA: Diagnosis not present

## 2021-01-03 LAB — BASIC METABOLIC PANEL
BUN: 14 mg/dL (ref 6–23)
CO2: 30 mEq/L (ref 19–32)
Calcium: 10.2 mg/dL (ref 8.4–10.5)
Chloride: 103 mEq/L (ref 96–112)
Creatinine, Ser: 0.75 mg/dL (ref 0.40–1.20)
GFR: 107.08 mL/min (ref >60.00)
Glucose, Bld: 81 mg/dL (ref 70–99)
Potassium: 4.7 mEq/L (ref 3.5–5.1)
Sodium: 141 mEq/L (ref 135–145)

## 2021-01-03 LAB — TSH: TSH: 1.13 u[IU]/mL (ref 0.35–4.50)

## 2021-01-03 MED ORDER — NIFEDIPINE ER 30 MG PO TB24: 30.0000 mg | ORAL_TABLET | Freq: Every day | ORAL | 1 refills | Status: AC

## 2021-01-03 NOTE — Assessment & Plan Note (Signed)
Repeat iron panel Current use of oral iron supplement

## 2021-01-03 NOTE — Patient Instructions (Addendum)
Go to lab for blood draw and urine collection Continue current medication.  Normal ECG.  Hypertension, Adult Hypertension is another name for high blood pressure. High blood pressure forces your heart to work harder to pump blood. This can cause problems over time. There are two numbers in a blood pressure reading. There is a top number (systolic) over a bottom number (diastolic). It is best to have a blood pressure that is below 120/80. Healthy choices can help lower your blood pressure, or you may need medicine to help lower it. What are the causes? The cause of this condition is not known. Some conditions may be related to high blood pressure. What increases the risk?  Smoking.  Having type 2 diabetes mellitus, high cholesterol, or both.  Not getting enough exercise or physical activity.  Being overweight.  Having too much fat, sugar, calories, or salt (sodium) in your diet.  Drinking too much alcohol.  Having long-term (chronic) kidney disease.  Having a family history of high blood pressure.  Age. Risk increases with age.  Race. You may be at higher risk if you are African American.  Gender. Men are at higher risk than women before age 33. After age 25, women are at higher risk than men.  Having obstructive sleep apnea.  Stress. What are the signs or symptoms?  High blood pressure may not cause symptoms. Very high blood pressure (hypertensive crisis) may cause: ? Headache. ? Feelings of worry or nervousness (anxiety). ? Shortness of breath. ? Nosebleed. ? A feeling of being sick to your stomach (nausea). ? Throwing up (vomiting). ? Changes in how you see. ? Very bad chest pain. ? Seizures. How is this treated?  This condition is treated by making healthy lifestyle changes, such as: ? Eating healthy foods. ? Exercising more. ? Drinking less alcohol.  Your health care provider may prescribe medicine if lifestyle changes are not enough to get your blood  pressure under control, and if: ? Your top number is above 130. ? Your bottom number is above 80.  Your personal target blood pressure may vary. Follow these instructions at home: Eating and drinking   If told, follow the DASH eating plan. To follow this plan: ? Fill one half of your plate at each meal with fruits and vegetables. ? Fill one fourth of your plate at each meal with whole grains. Whole grains include whole-wheat pasta, brown rice, and whole-grain bread. ? Eat or drink low-fat dairy products, such as skim milk or low-fat yogurt. ? Fill one fourth of your plate at each meal with low-fat (lean) proteins. Low-fat proteins include fish, chicken without skin, eggs, beans, and tofu. ? Avoid fatty meat, cured and processed meat, or chicken with skin. ? Avoid pre-made or processed food.  Eat less than 1,500 mg of salt each day.  Do not drink alcohol if: ? Your doctor tells you not to drink. ? You are pregnant, may be pregnant, or are planning to become pregnant.  If you drink alcohol: ? Limit how much you use to:  0-1 drink a day for women.  0-2 drinks a day for men. ? Be aware of how much alcohol is in your drink. In the [place] one drink equals one 12 oz bottle of beer (355 mL), one 5 oz glass of wine (148 mL), or one 1 oz glass of hard liquor (44 mL). Lifestyle   Work with your doctor to stay at a healthy weight or to lose weight. Ask your doctor  what the best weight is for you.  Get at least 30 minutes of exercise most days of the week. This may include walking, swimming, or biking.  Get at least 30 minutes of exercise that strengthens your muscles (resistance exercise) at least 3 days a week. This may include lifting weights or doing Pilates.  Do not use any products that contain nicotine or tobacco, such as cigarettes, e-cigarettes, and chewing tobacco. If you need help quitting, ask your doctor.  Check your blood pressure at home as told by your doctor.  Keep all  follow-up visits as told by your doctor. This is important. Medicines  Take over-the-counter and prescription medicines only as told by your doctor. Follow directions carefully.  Do not skip doses of blood pressure medicine. The medicine does not work as well if you skip doses. Skipping doses also puts you at risk for problems.  Ask your doctor about side effects or reactions to medicines that you should watch for. Contact a doctor if you:  Think you are having a reaction to the medicine you are taking.  Have headaches that keep coming back (recurring).  Feel dizzy.  Have swelling in your ankles.  Have trouble with your vision. Get help right away if you:  Get a very bad headache.  Start to feel mixed up (confused).  Feel weak or numb.  Feel faint.  Have very bad pain in your: ? Chest. ? Belly (abdomen).  Throw up more than once.  Have trouble breathing. Summary  Hypertension is another name for high blood pressure.  High blood pressure forces your heart to work harder to pump blood.  For most people, a normal blood pressure is less than 120/80.  Making healthy choices can help lower blood pressure. If your blood pressure does not get lower with healthy choices, you may need to take medicine. This information is not intended to replace advice given to you by your health care provider. Make sure you discuss any questions you have with your health care provider. Document Revised: 08/27/2018 Document Reviewed: 08/27/2018 Elsevier Patient Education  2020 [employer].

## 2021-01-03 NOTE — Progress Notes (Addendum)
Subjective:  Patient ID: Penny Abbott, female    DOB: 11/17/1991  Age: 30 y.o. MRN: [id number]  CC: Follow-up (Pt c/o elevated BP readings, pt states when she went to her obgyn appointment her BP was elevated and she was informed to follow up with her PCP. )  HPI  Hypertension Elevated BP 6weeks after childbirth (09/2020) per GYN. Denies any issues during pregnancy. No use of hormonal contraception. No tobacco or ETOH use BP improved with use of nefedipine 30mg . Denies any adverse side effects. BP Readings from Last 3 Encounters:  01/03/21 112/80  10/25/20 140/83  10/14/20 119/74   Check TSH, BMP, and urine protein Maintain nifedipine dose.  Reviewed past Medical, Social and Family history today.  Outpatient Medications Prior to Visit  Medication Sig Dispense Refill  . acetaminophen (TYLENOL) 500 MG tablet Take 2 tablets (1,000 mg total) by mouth every 6 (six) hours. 30 tablet 0  . ibuprofen (ADVIL) 200 MG tablet Take 400 mg by mouth every 6 (six) hours as needed for moderate pain.    . iron polysaccharides (NIFEREX) 150 MG capsule Take 1 capsule (150 mg total) by mouth daily. 30 capsule 2  . Prenatal Vit-Fe Fumarate-FA (PRENATAL MULTIVITAMIN) TABS tablet Take 1 tablet by mouth daily at 12 noon.    10/16/20 NIFEdipine (ADALAT CC) 30 MG 24 hr tablet Take 1 tablet (30 mg total) by mouth daily. 30 tablet 3  . ferrous sulfate 325 (65 FE) MG tablet Take 1 tablet (325 mg total) by mouth 2 (two) times daily with a meal. (Patient not taking: Reported on 01/03/2021) 60 tablet 5  . oxyCODONE (OXY IR/ROXICODONE) 5 MG immediate release tablet Take 1 tablet (5 mg total) by mouth every 4 (four) hours as needed for moderate pain, severe pain or breakthrough pain. (Patient not taking: Reported on 01/03/2021) 6 tablet 0   No facility-administered medications prior to visit.    ROS See HPI  Objective:  BP 112/80 (BP Location: Left Arm, Patient Position: Sitting, Cuff Size: Normal)   Pulse 86   Temp  (!) 96.9 F (36.1 C) (Temporal)   Ht 5\' 6"  (1.676 m)   Wt 154 lb (69.9 kg)   LMP 01/11/2020   SpO2 98%   BMI 24.86 kg/m   Physical Exam Cardiovascular:     Rate and Rhythm: Normal rate and regular rhythm.     Heart sounds: Normal heart sounds. No gallop. No S3 or S4 sounds.   Pulmonary:     Effort: Pulmonary effort is normal.     Breath sounds: Normal breath sounds.  Musculoskeletal:     Right lower leg: No edema.     Left lower leg: No edema.  Neurological:     Mental Status: She is alert and oriented to person, place, and time.    Assessment & Plan:  This visit occurred during the SARS-CoV-2 public health emergency.  Safety protocols were in place, including screening questions prior to the visit, additional usage of staff PPE, and extensive cleaning of exam room while observing appropriate contact time as indicated for disinfecting solutions.   Joslyne was seen today for follow-up.  Diagnoses and all orders for this visit:  Hypertension, unspecified type -     EKG 12-Lead -     Basic metabolic panel -     Protein / creatinine ratio, urine -     TSH  Iron deficiency anemia secondary to inadequate dietary iron intake -     Iron, TIBC and Ferritin Panel  Problem List Items Addressed This Visit      Cardiovascular and Mediastinum   Hypertension - Primary    Elevated BP 6weeks after childbirth (09/2020) per GYN. Denies any issues during pregnancy. No use of hormonal contraception. No tobacco or ETOH use BP improved with use of nefedipine 30mg . Denies any adverse side effects. BP Readings from Last 3 Encounters:  01/03/21 112/80  10/25/20 140/83  10/14/20 119/74   Check TSH, BMP, and urine protein Maintain nifedipine dose.      Relevant Orders   EKG 12-Lead (Completed)   Basic metabolic panel   Protein / creatinine ratio, urine   TSH     Other   Iron deficiency anemia secondary to inadequate dietary iron intake   Relevant Orders   Iron, TIBC and  Ferritin Panel      Follow-up: Return in about 4 weeks (around 01/31/2021) for HTN.  03/31/2021, NP

## 2021-01-03 NOTE — Assessment & Plan Note (Addendum)
Elevated BP 6weeks after childbirth (09/2020) per GYN. Denies any issues during pregnancy. No use of hormonal contraception. No tobacco or ETOH use BP improved with use of nefedipine 30mg . Denies any adverse side effects. BP Readings from Last 3 Encounters:  01/03/21 112/80  10/25/20 140/83  10/14/20 119/74   ECG checked: NSR, no previous to compare, no LVH Check TSH, BMP, and urine protein Maintain nifedipine dose.

## 2021-01-04 LAB — PROTEIN / CREATININE RATIO, URINE
Creatinine, Urine: 13 mg/dL — ABNORMAL LOW (ref 20–275)
Total Protein, Urine: <4 mg/dL — ABNORMAL LOW (ref 5–24)

## 2021-01-04 LAB — IRON,TIBC AND FERRITIN PANEL
%SAT: 19 % (calc) (ref 16–45)
Ferritin: 30 ng/mL (ref 16–154)
Iron: 76 ug/dL (ref 40–190)
TIBC: 401 mcg/dL (calc) (ref 250–450)
